# Patient Record
Sex: Male | Born: 1970 | Race: White | Hispanic: No | Marital: Single | Smoking: Former smoker
Health system: Southern US, Community
[De-identification: ages and names within clinical notes are randomized; demographics above are authoritative.]

## PROBLEM LIST (undated history)

## (undated) HISTORY — PX: EYE SURGERY: SHX253

---

## 1998-03-01 ENCOUNTER — Emergency Department (HOSPITAL_COMMUNITY): Admission: EM | Admit: 1998-03-01 | Discharge: 1998-03-02 | Payer: Self-pay | Admitting: Emergency Medicine

## 2010-05-03 ENCOUNTER — Emergency Department (HOSPITAL_COMMUNITY)
Admission: EM | Admit: 2010-05-03 | Discharge: 2010-05-03 | Payer: Self-pay | Source: Home / Self Care | Admitting: Family Medicine

## 2015-01-14 ENCOUNTER — Encounter: Payer: Self-pay | Admitting: Family Medicine

## 2015-01-14 ENCOUNTER — Ambulatory Visit (INDEPENDENT_AMBULATORY_CARE_PROVIDER_SITE_OTHER): Payer: Self-pay | Admitting: Family Medicine

## 2015-01-14 VITALS — BP 104/59 | HR 77 | Temp 98.7°F | Resp 20 | Ht 64.0 in | Wt 134.0 lb

## 2015-01-14 DIAGNOSIS — M25561 Pain in right knee: Secondary | ICD-10-CM

## 2015-01-14 NOTE — Assessment & Plan Note (Signed)
Pain and popping for 6 months, better with brace, nsaids, steroids, normal exam. 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 Ice and elevation in the evenings

## 2015-01-14 NOTE — Progress Notes (Signed)
   Subjective:    Patient ID: Billy Simmons, male    DOB: [birth year], 44 y.o.   MRN: [id number]  HPI Pt presents to establish care. 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 Otherwise, no meds, surgeries, hospitalizations. Family history reviewed and documented in chart.   He has been having knee pain for the past 6 months. It started when he was squatting in a crawl space and twisted his knee. It pops and hurts and becomes very inflamed if he does not wear his brace. As long as he wears the brace it does not bother him. When it got very swollen he went to an urgent care and did a steroid taper which helped, he occasionally uses ibuprofen when it acts up but usually does not need anything. It does not give out or feel unstable. No fevers/chills.   Review of Systems See HPI    Objective:   Physical Exam  Constitutional: He is oriented to person, place, and time. He appears well-developed and well-nourished. No distress.  HENT:  Head: Normocephalic and atraumatic.  Eyes: Conjunctivae are normal.  Cardiovascular: Normal rate, regular rhythm, normal heart sounds and intact distal pulses.   No murmur heard. Pulmonary/Chest: Effort normal and breath sounds normal. No respiratory distress. He has no wheezes.  Abdominal: Soft. Bowel sounds are normal. He exhibits no distension. There is no tenderness.  Musculoskeletal:       Right knee: He exhibits normal range of motion, no swelling, no effusion, no ecchymosis, no deformity, no laceration, no erythema, normal alignment, no LCL laxity, normal patellar mobility, no bony tenderness and no MCL laxity. No tenderness found.  Negative drawer signs  Neurological: He is alert and oriented to person, place, and time.  Skin: Skin is warm and dry. He is not diaphoretic.  Psychiatric: He has a normal mood and affect. His behavior is normal.  Nursing note and vitals reviewed.           Assessment & Plan:

## 2015-01-14 NOTE — Patient Instructions (Addendum)
Meniscus Tear with Phase I Rehab The meniscus is a C-shaped cartilage structure, located in the knee joint between the thigh bone (femur) and the shinbone (tibia). Two menisci are located in each knee joint: the inner and outer meniscus. The meniscus acts as an adapter between the thigh bone and shinbone, allowing them to fit properly together. It also functions as a shock absorber, to reduce the stress placed on the knee joint and to help supply nutrients to the knee joint cartilage. As people age, the meniscus begins to harden and become more vulnerable to injury. Meniscus tears are a common injury, especially in older athletes. Inner meniscus tears are more common than outer meniscus tears.  SYMPTOMS   Pain in the knee, especially with standing or squatting with the affected leg.  Tenderness along the joint line.  Swelling in the knee joint (effusion), usually starting 1 to 2 days after injury.  Locking or catching of the knee joint, causing inability to straighten the knee completely.  Giving way or buckling of the knee. CAUSES  A meniscus tear occurs when a force is placed on the meniscus that is greater than it can handle. Common causes of injury include:  Direct hit (trauma) to the knee.  Twisting, pivoting, or cutting (rapidly changing direction while running), kneeling or squatting.  Without injury, due to aging. RISK INCREASES WITH:  Contact sports (football, rugby).  Sports in which cleats are used with pivoting (soccer, lacrosse) or sports in which good shoe grip and sudden change in direction are required (racquetball, basketball, squash).  Previous knee injury.  Associated knee injury, particularly ligament injuries.  Poor strength and flexibility. PREVENTION  Warm up and stretch properly before activity.  Maintain physical fitness:  Strength, flexibility, and endurance.  Cardiovascular fitness.  Protect the knee with a brace or elastic bandage.  Wear  properly fitted protective equipment (proper cleats for the surface). PROGNOSIS  Sometimes, meniscus tears heal on their own. However, definitive treatment requires surgery, followed by at least 6 weeks of recovery.  RELATED COMPLICATIONS   Recurring symptoms that result in a chronic problem.  Repeated knee injury, especially if sports are resumed too soon after injury or surgery.  Progression of the tear (the tear gets larger), if untreated.  Arthritis of the knee in later years (with or without surgery).  Complications of surgery, including infection, bleeding, injury to nerves (numbness, weakness, paralysis) continued pain, giving way, locking, nonhealing of meniscus (if repaired), need for further surgery, and knee stiffness (loss of motion). TREATMENT  Treatment first involves the use of ice and medicine, to reduce pain and inflammation. You may find using crutches to walk more comfortable. However, it is okay to bear weight on the injured knee, if the pain will allow it. Surgery is often advised as a definitive treatment. Surgery is performed through an incision near the joint (arthroscopically). The torn piece of the meniscus is removed, and if possible the joint cartilage is repaired. After surgery, the joint must be restrained. After restraint, it is important to perform strengthening and stretching exercises to help regain strength and a full range of motion. These exercises may be completed at home or with a therapist.  MEDICATION  If pain medicine is needed, nonsteroidal anti-inflammatory medicines (aspirin and ibuprofen), or other minor pain relievers (acetaminophen), are often advised.  Do not take pain medicine for 7 days before surgery.  Prescription pain relievers may be given, if your caregiver thinks they are needed. Use only as directed and   only as much as you need. HEAT AND COLD  Cold treatment (icing) should be applied for 10 to 15 minutes every 2 to 3 hours for  inflammation and pain, and immediately after activity that aggravates your symptoms. Use ice packs or an ice massage.  Heat treatment may be used before performing stretching and strengthening activities prescribed by your caregiver, physical therapist, or athletic trainer. Use a heat pack or a warm water soak. SEEK MEDICAL CARE IF:   Symptoms get worse or do not improve in 2 weeks, despite treatment.  New, unexplained symptoms develop. (Drugs used in treatment may produce side effects.) EXERCISES RANGE OF MOTION (ROM) AND STRETCHING EXERCISES - Meniscus Tear, Non-operative, Phase I These are some of the initial exercises with which you may start your rehabilitation program, until you see your caregiver again or until your symptoms are resolved. Remember:   These initial exercises are intended to be gentle. They will help you restore motion without increasing any swelling.  Completing these exercises allows less painful movement and prepares you for the more aggressive strengthening exercises in Phase II.  An effective stretch should be held for at least 30 seconds.  A stretch should never be painful. You should only feel a gentle lengthening or release in the stretched tissue. RANGE OF MOTION - Knee Flexion, Active  Lie on your back with both knees straight. (If this causes back discomfort, bend your healthy knee, placing your foot flat on the floor.)  Slowly slide your heel back toward your buttocks until you feel a gentle stretch in the front of your knee or thigh.  Slowly slide your heel back to the starting position.Marland Kitchen  RANGE OF MOTION - Knee Flexion and Extension, Active-Assisted  Sit on the edge of a table or chair with your thighs firmly supported. It may be helpful to place a folded towel under the end of your right / left thigh.  Flexion (bending): Place the ankle of your healthy leg on top of the other ankle. Use your healthy leg to gently bend your right / left knee until you  feel a mild tension across the top of your knee.  Extension (straightening): Switch your ankles so your right / left leg is on top. Use your healthy leg to straighten your right / left knee until you feel a mild tension on the backside of your knee.  STRETCH - Knee Flexion, Supine  Lie on the floor with your right / left heel and foot lightly touching the wall. (Place both feet on the wall if you do not use a door frame.)  Without using any effort, allow gravity to slide your foot down the wall slowly until you feel a gentle stretch in the front of your right / left knee.  Then return the leg to the starting position, using your healthy leg for help, if needed.  STRETCH - Knee Extension Sitting  Sit with your right / left leg/heel propped on another chair, coffee table, or foot stool.  Allow your leg muscles to relax, letting gravity straighten out your knee.*  You should feel a stretch behind your right / left knee.   STRENGTHENING EXERCISES - Meniscus Tear, Non-operative, Phase I These exercises may help you when beginning to rehabilitate your injury. They may resolve your symptoms with or without further involvement from your physician, physical therapist or athletic trainer. While completing these exercises, remember:   Muscles can gain both the endurance and the strength needed for everyday activities through  controlled exercises.  Complete these exercises as instructed by your physician, physical therapist or athletic trainer. Progress the resistance and repetitions only as guided. STRENGTH - Quadriceps, Isometrics  Lie on your back with your right / left leg extended and your opposite knee bent.  Gradually tense the muscles in the front of your right / left thigh. You should see either your knee cap slide up toward your hip or increased dimpling just above the knee. This motion will push the back of the knee down toward the floor, mat, or bed on which you are lying.  Hold the  muscle as tight as you can, without increasing your pain  Relax the muscles slowly and completely between each repetition.  STRENGTH - Quadriceps, Short Arcs   Lie on your back. Place a towel roll under your right / left knee, so that the knee bends slightly.  Raise only your lower leg by tightening the muscles in the front of your thigh. Do not allow your thigh to rise.  STRENGTH - Quadriceps, Straight Leg Raises  Quality counts! Watch for signs that the quadriceps muscle is working, to be sure you are strengthening the correct muscles and not "cheating" by substituting with healthier muscles.  Lay on your back with your right / left leg extended and your opposite knee bent.  Tense the muscles in the front of your right / left thigh. You should see either your knee cap slide up or increased dimpling just above the knee. Your thigh may even shake a bit.  Tighten these muscles even more and raise your leg 4 to 6 inches off the floor.  Keeping these muscles tense, lower your leg.  Relax the muscles slowly and completely in between each repetition.  STRENGTH - Hamstring, Curls   Lay on your stomach with your legs extended. (If you lay on a bed, your feet may hang over the edge.)  Tighten the muscles in the back of your thigh to bend your right / left knee up to 90 degrees. Keep your hips flat on the bed.  Hold this position  Slowly lower your leg back to the starting position.  STRENGTH - Quadriceps, Squats  Stand in a door frame so that your feet and knees are in line with the frame.  Use your hands for balance, not support, on the frame.  Slowly lower your weight, bending at the hips and knees. Keep your lower legs upright so that they are parallel with the door frame. Squat only within the range that does not increase your knee pain. Never let your hips drop below your knees.  Slowly return upright, pushing with your legs, not pulling with your hands.  STRENGTH - Quad/VMO,  Isometric   Sit in a chair with your right / left knee slightly bent. With your fingertips, feel the VMO muscle just above the inside of your knee. The VMO is important in controlling the position of your kneecap.  Keeping your fingertips on this muscle. Without actually moving your leg, attempt to drive your knee down as if straightening your leg. You should feel your VMO tense. If you have a difficult time, you may wish to try the same exercise on your healthy knee first.  Tense this muscle as hard as you can without increasing any knee pain.  Relax the muscles slowly and completely in between each repetition. Document Released: 07/30/1998 Document Revised: 10/08/2011 Document Reviewed: 10/28/2008 [facility] Patient Information 2015 [place], [place]. This information is not intended to  replace advice given to you by your health care provider. Make sure you discuss any questions you have with your health care provider.

## 2015-11-03 ENCOUNTER — Ambulatory Visit (INDEPENDENT_AMBULATORY_CARE_PROVIDER_SITE_OTHER): Payer: Self-pay | Admitting: Family Medicine

## 2015-11-03 VITALS — BP 128/84 | HR 81 | Temp 98.0°F | Resp 18 | Ht 64.0 in | Wt 130.0 lb

## 2015-11-03 DIAGNOSIS — M25442 Effusion, left hand: Secondary | ICD-10-CM

## 2015-11-03 NOTE — Patient Instructions (Addendum)
     IF you received an x-ray today, you will receive an invoice from [facility]. Please contact [facility] at [phone] with questions or concerns regarding your invoice.   IF you received labwork today, you will receive an invoice from [facility]. Please contact [facility] at [phone] with questions or concerns regarding your invoice.   Our billing staff will not be able to assist you with questions regarding bills from these companies.  You will be contacted with the lab results as soon as they are available. The fastest way to get your results is to activate your My Chart account. Instructions are located on the last page of this paperwork. If you have not heard from us regarding the results in 2 weeks, please contact this office.     You likely did have a jammed finger or possible fracture in that knuckle, but with the good range of motion and function you have today, I do not anticipate any specific treatment by hand surgeon at this time. If you would like to have an x-ray - return to your convenience.  If pain/difficulty with use of the hand, would recommend physical therapy with hand therapist.  You also have the option of seeing a hand surgeon, Let me know, and I can refer you without an office visit.    Return to the clinic or go to the nearest emergency room if any of your symptoms worsen or new symptoms occur.

## 2015-11-03 NOTE — Progress Notes (Signed)
Subjective:  By signing my name below, I, Stann Oresung-Kai Tsai, attest that this documentation has been prepared under the direction and in the presence of Meredith StaggersJeffrey Brecklynn Jian, MD. Electronically Signed: Stann Oresung-Kai Tsai, Scribe. 11/03/2015 , 8:53 AM .  Patient was seen in Room 2 .   Patient ID: Billy Simmons, male    DOB: 19-Dec-1970, 45 y.o.   MRN: [id number] Chief Complaint  Patient presents with  . Finger Injury    left middle finger. Injury happened late last year.    HPI Billy Simmons is a 45 y.o. male  Patient is here with left middle finger pain from an injury that occurred about 5 months ago (Oct - Nov 2016). He reports being on a ladder fixing his gutter, and the ladder was about to fall. His left middle finger was caught in the rung of the ladder and the finger was pulled towards the thumb. Initially, it swelled up in the middle of the knuckle and bruising on the side of the finger. He applied some ice and the swelling did improve. He is able to function and work with it; although, he occasionally has a pop and soreness in the area when he does specific activity while at work. He denies xray or treatment.   He works in Estate agentpainting and carpentry.  He's ambidextrous, but primarily right hand dominant.   Patient Active Problem List   Diagnosis Date Noted  . Right knee pain 01/14/2015   History reviewed. No pertinent past medical history. Past Surgical History  Procedure Laterality Date  . Eye surgery  as a child   No Known Allergies Prior to Admission medications   Not on File   Social History   Social History  . Marital Status: Single    Spouse Name: N/A  . Number of Children: N/A  . Years of Education: N/A   Occupational History  . Not on file.   Social History Main Topics  . Smoking status: Former Smoker    Types: Cigarettes    Quit date: 07/31/2011  . Smokeless tobacco: Never Used  . Alcohol Use: 1.2 oz/week    2 Cans of beer per week  . Drug Use: Yes    Special:  Marijuana  . Sexual Activity: Yes   Other Topics Concern  . Not on file   Social History Narrative   Review of Systems  Constitutional: Negative for fever, chills and fatigue.  Gastrointestinal: Negative for nausea, vomiting and diarrhea.  Musculoskeletal: Positive for arthralgias. Negative for myalgias and joint swelling.  Skin: Negative for rash and wound.  Neurological: Negative for weakness and numbness.      Objective:   Physical Exam  Constitutional: He is oriented to person, place, and time. He appears well-developed and well-nourished. No distress.  HENT:  Head: Normocephalic and atraumatic.  Eyes: EOM are normal. Pupils are equal, round, and reactive to light.  Neck: Neck supple.  Cardiovascular: Normal rate.   Pulmonary/Chest: Effort normal. No respiratory distress.  Musculoskeletal: Normal range of motion.  Prominence left 3rd PIP dorsally, some bony prominence dorsal and medial lateral, full extension, full extension strength, full flexion at the PIP, full flexion strength  Neurological: He is alert and oriented to person, place, and time.  Skin: Skin is warm and dry.  Psychiatric: He has a normal mood and affect. His behavior is normal.  Nursing note and vitals reviewed.   Filed Vitals:   11/03/15 0826  BP: 128/84  Pulse: 81  Temp: 98 F (  36.7 C)  TempSrc: Oral  Resp: 18  Height:  (1.626 m)  Weight: 130 lb (58.968 kg)  SpO2: 98%      Assessment & Plan:   Billy Simmons is a 45 y.o. male Swelling of joint of hand, left  Suspected jammed PIP of third finger versus fracture, but now 5 months out from injury. He actually has good range of motion and strength at that joint, minimal discomfort, and slight swelling is likely callus/bony healing. Offered physical therapy or hand surgeon evaluation, but do not anticipate any specific treatment at this time because of his good range of motion and function. Also discussed x-ray, but this was declined at  present RTC precautions discussed, but if he would like to see hand surgeon or physical therapy can call and I can refer him there.  No orders of the defined types were placed in this encounter.      Patient Instructions       IF you received an x-ray today, you will receive an invoice from [facility]. Please contact [facility] at [phone] with questions or concerns regarding your invoice.   IF you received labwork today, you will receive an invoice from [facility]. Please contact [facility] at [phone] with questions or concerns regarding your invoice.   Our billing staff will not be able to assist you with questions regarding bills from these companies.  You will be contacted with the lab results as soon as they are available. The fastest way to get your results is to activate your My Chart account. Instructions are located on the last page of this paperwork. If you have not heard from [place] regarding the results in 2 weeks, please contact this office.     You likely did have a jammed finger or possible fracture in that knuckle, but with the good range of motion and function you have today, I do not anticipate any specific treatment by hand surgeon at this time. If you would like to have an x-ray - return to your convenience.  If pain/difficulty with use of the hand, would recommend physical therapy with hand therapist.  You also have the option of seeing a hand surgeon, Let me know, and I can refer you without an office visit.    Return to the clinic or go to the nearest emergency room if any of your symptoms worsen or new symptoms occur.

## 2015-12-22 ENCOUNTER — Ambulatory Visit (INDEPENDENT_AMBULATORY_CARE_PROVIDER_SITE_OTHER): Payer: Self-pay | Admitting: Family Medicine

## 2015-12-22 VITALS — BP 112/70 | HR 89 | Temp 98.3°F | Resp 18 | Ht 64.0 in | Wt 133.2 lb

## 2015-12-22 DIAGNOSIS — J069 Acute upper respiratory infection, unspecified: Secondary | ICD-10-CM

## 2015-12-22 MED ORDER — AMOXICILLIN 875 MG PO TABS: 875.0000 mg | ORAL_TABLET | Freq: Two times a day (BID) | ORAL | Status: AC

## 2015-12-22 NOTE — Patient Instructions (Addendum)
You likely have a viral illness, please try to treat with over the counter medications for cough (sample Mucinex DM) and pain/fever (sample Aleve). If you are not better in 3-5 days, can start antibiotic.  Drink enough liquids until your urine is light yellow.  Please come back in or go to the emergency room if you develop a persistent fever over 101, shortness of breath/wheeze or persistent vomiting    IF you received an x-ray today, you will receive an invoice from [facility]. Please contact [facility] at [phone] with questions or concerns regarding your invoice.   IF you received labwork today, you will receive an invoice from [facility]. Please contact [facility] at [phone] with questions or concerns regarding your invoice.   Our billing staff will not be able to assist you with questions regarding bills from these companies.  You will be contacted with the lab results as soon as they are available. The fastest way to get your results is to activate your My Chart account. Instructions are located on the last page of this paperwork. If you have not heard from us regarding the results in 2 weeks, please contact this office.     Upper Respiratory Infection, Adult Most upper respiratory infections (URIs) are a viral infection of the air passages leading to the lungs. A URI affects the nose, throat, and upper air passages. The most common type of URI is nasopharyngitis and is typically referred to as "the common cold." URIs run their course and usually go away on their own. Most of the time, a URI does not require medical attention, but sometimes a bacterial infection in the upper airways can follow a viral infection. This is called a secondary infection. Sinus and middle ear infections are common types of secondary upper respiratory infections. Bacterial pneumonia can also complicate a URI. A URI can worsen asthma and chronic obstructive  pulmonary disease (COPD). Sometimes, these complications can require emergency medical care and may be life threatening.  CAUSES Almost all URIs are caused by viruses. A virus is a type of germ and can spread from one person to another.  RISKS FACTORS You may be at risk for a URI if:   You smoke.   You have chronic heart or lung disease.  You have a weakened defense (immune) system.   You are very young or very old.   You have nasal allergies or asthma.  You work in crowded or poorly ventilated areas.  You work in health care facilities or schools. SIGNS AND SYMPTOMS  Symptoms typically develop 2-3 days after you come in contact with a cold virus. Most viral URIs last 7-10 days. However, viral URIs from the influenza virus (flu virus) can last 14-18 days and are typically more severe. Symptoms may include:   Runny or stuffy (congested) nose.   Sneezing.   Cough.   Sore throat.   Headache.   Fatigue.   Fever.   Loss of appetite.   Pain in your forehead, behind your eyes, and over your cheekbones (sinus pain).  Muscle aches.  DIAGNOSIS  Your health care provider may diagnose a URI by:  Physical exam.  Tests to check that your symptoms are not due to another condition such as:  Strep throat.  Sinusitis.  Pneumonia.  Asthma. TREATMENT  A URI goes away on its own with time. It cannot be cured with medicines, but medicines may be prescribed or recommended to relieve symptoms. Medicines may help:  Reduce  your fever.  Reduce your cough.  Relieve nasal congestion. HOME CARE INSTRUCTIONS   Take medicines only as directed by your health care provider.   Gargle warm saltwater or take cough drops to comfort your throat as directed by your health care provider.  Use a warm mist humidifier or inhale steam from a shower to increase air moisture. This may make it easier to breathe.  Drink enough fluid to keep your urine clear or pale yellow.   Eat  soups and other clear broths and maintain good nutrition.   Rest as needed.   Return to work when your temperature has returned to normal or as your health care provider advises. You may need to stay home longer to avoid infecting others. You can also use a face mask and careful hand washing to prevent spread of the virus.  Increase the usage of your inhaler if you have asthma.   Do not use any tobacco products, including cigarettes, chewing tobacco, or electronic cigarettes. If you need help quitting, ask your health care provider. PREVENTION  The best way to protect yourself from getting a cold is to practice good hygiene.   Avoid oral or hand contact with people with cold symptoms.   Wash your hands often if contact occurs.  There is no clear evidence that vitamin C, vitamin E, echinacea, or exercise reduces the chance of developing a cold. However, it is always recommended to get plenty of rest, exercise, and practice good nutrition.  SEEK MEDICAL CARE IF:   You are getting worse rather than better.   Your symptoms are not controlled by medicine.   You have chills.  You have worsening shortness of breath.  You have brown or red mucus.  You have yellow or brown nasal discharge.  You have pain in your face, especially when you bend forward.  You have a fever.  You have swollen neck glands.  You have pain while swallowing.  You have white areas in the back of your throat. SEEK IMMEDIATE MEDICAL CARE IF:   You have severe or persistent:  Headache.  Ear pain.  Sinus pain.  Chest pain.  You have chronic lung disease and any of the following:  Wheezing.  Prolonged cough.  Coughing up blood.  A change in your usual mucus.  You have a stiff neck.  You have changes in your:  Vision.  Hearing.  Thinking.  Mood. MAKE SURE YOU:   Understand these instructions.  Will watch your condition.  Will get help right away if you are not doing well or  get worse.   This information is not intended to replace advice given to you by your health care provider. Make sure you discuss any questions you have with your health care provider.   Document Released: 01/09/2001 Document Revised: 11/30/2014 Document Reviewed: 10/21/2013 Elsevier Interactive Patient Education [employer]

## 2015-12-22 NOTE — Progress Notes (Signed)
   Subjective:    Patient ID: Billy Simmons, male    DOB: Jul 03, 1971, 45 y.o.   MRN: [id number]  HPI This is a pleasant male who presents today with 2 days of cough, chest congestion, chest pain with cough, green sputum. Smoked cigarettes until 4 years ago. Smokes occasional marijuana. Took tylenol yesterday, slept ok. Feels fatigued. Sinus pressure. Itchy throat, no ear pain.   History reviewed. No pertinent past medical history. Past Surgical History  Procedure Laterality Date  . Eye surgery  as a child   Family History  Problem Relation Age of Onset  . Depression Mother   . Mental illness Mother   . Cancer Father    Social History  Substance Use Topics  . Smoking status: Former Smoker    Types: Cigarettes    Quit date: 07/31/2011  . Smokeless tobacco: Never Used  . Alcohol Use: 1.2 oz/week    2 Cans of beer per week      Review of Systems Per HPI     Objective:   Physical Exam  Constitutional: He is oriented to person, place, and time. He appears well-developed and well-nourished.  HENT:  Head: Normocephalic and atraumatic.  Right Ear: Tympanic membrane, external ear and ear canal normal.  Left Ear: Tympanic membrane, external ear and ear canal normal.  Nose: Mucosal edema and rhinorrhea present. Right sinus exhibits no maxillary sinus tenderness and no frontal sinus tenderness. Left sinus exhibits no maxillary sinus tenderness and no frontal sinus tenderness.  Mouth/Throat: Uvula is midline and mucous membranes are normal. Posterior oropharyngeal erythema present. No oropharyngeal exudate or posterior oropharyngeal edema.  Cardiovascular: Normal rate, regular rhythm and normal heart sounds.   Pulmonary/Chest: Effort normal and breath sounds normal.  Neurological: He is alert and oriented to person, place, and time.  Skin: Skin is warm and dry.  Psychiatric: He has a normal mood and affect. His behavior is normal. Judgment and thought content normal.  Vitals  reviewed.     BP 112/70 mmHg  Pulse 89  Temp(Src) 98.3 F (36.8 C) (Oral)  Resp 18  Ht 5\' 4"  (1.626 m)  Wt 133 lb 3.2 oz (60.419 kg)  BMI 22.85 kg/m2  SpO2 97%     Assessment & Plan:  1. Upper respiratory infection with cough and congestion - likely viral, discussed with patient and provided instructions for symptomatic relief  - Provided wait and see antibiotic prescription that he can take if not better in 3-5 days - amoxicillin (AMOXIL) 875 MG tablet; Take 1 tablet (875 mg total) by mouth 2 (two) times daily.  Dispense: 14 tablet; Refill: 0   Olean Reeeborah Kleo Dungee, FNP-BC  [facility]  12/24/2015 5:07 PM

## 2017-07-02 ENCOUNTER — Emergency Department (HOSPITAL_COMMUNITY): Payer: Self-pay

## 2017-07-02 ENCOUNTER — Encounter (HOSPITAL_COMMUNITY): Payer: Self-pay

## 2017-07-02 ENCOUNTER — Other Ambulatory Visit: Payer: Self-pay

## 2017-07-02 ENCOUNTER — Inpatient Hospital Stay (HOSPITAL_COMMUNITY): Payer: Self-pay

## 2017-07-02 ENCOUNTER — Inpatient Hospital Stay (HOSPITAL_COMMUNITY)
Admission: EM | Admit: 2017-07-02 | Discharge: 2017-07-09 | DRG: 511 | Disposition: A | Discharge status: Home / Self Care | Payer: Self-pay | Attending: General Surgery | Admitting: General Surgery

## 2017-07-02 DIAGNOSIS — S0012XA Contusion of left eyelid and periocular area, initial encounter: Secondary | ICD-10-CM | POA: Diagnosis present

## 2017-07-02 DIAGNOSIS — W19XXXA Unspecified fall, initial encounter: Secondary | ICD-10-CM

## 2017-07-02 DIAGNOSIS — W1789XA Other fall from one level to another, initial encounter: Secondary | ICD-10-CM

## 2017-07-02 DIAGNOSIS — S2242XA Multiple fractures of ribs, left side, initial encounter for closed fracture: Secondary | ICD-10-CM

## 2017-07-02 DIAGNOSIS — Z23 Encounter for immunization: Secondary | ICD-10-CM

## 2017-07-02 DIAGNOSIS — S2249XA Multiple fractures of ribs, unspecified side, initial encounter for closed fracture: Secondary | ICD-10-CM

## 2017-07-02 DIAGNOSIS — S52572A Other intraarticular fracture of lower end of left radius, initial encounter for closed fracture: Principal | ICD-10-CM | POA: Diagnosis present

## 2017-07-02 DIAGNOSIS — E876 Hypokalemia: Secondary | ICD-10-CM | POA: Diagnosis present

## 2017-07-02 DIAGNOSIS — R52 Pain, unspecified: Secondary | ICD-10-CM

## 2017-07-02 DIAGNOSIS — J939 Pneumothorax, unspecified: Secondary | ICD-10-CM

## 2017-07-02 DIAGNOSIS — Z9889 Other specified postprocedural states: Secondary | ICD-10-CM

## 2017-07-02 DIAGNOSIS — S52502A Unspecified fracture of the lower end of left radius, initial encounter for closed fracture: Secondary | ICD-10-CM

## 2017-07-02 DIAGNOSIS — S299XXA Unspecified injury of thorax, initial encounter: Secondary | ICD-10-CM

## 2017-07-02 DIAGNOSIS — Z9689 Presence of other specified functional implants: Secondary | ICD-10-CM

## 2017-07-02 DIAGNOSIS — T148XXA Other injury of unspecified body region, initial encounter: Secondary | ICD-10-CM

## 2017-07-02 DIAGNOSIS — W11XXXA Fall on and from ladder, initial encounter: Secondary | ICD-10-CM | POA: Diagnosis present

## 2017-07-02 DIAGNOSIS — J9382 Other air leak: Secondary | ICD-10-CM

## 2017-07-02 DIAGNOSIS — F1290 Cannabis use, unspecified, uncomplicated: Secondary | ICD-10-CM | POA: Diagnosis present

## 2017-07-02 DIAGNOSIS — Z87891 Personal history of nicotine dependence: Secondary | ICD-10-CM

## 2017-07-02 DIAGNOSIS — S060X9A Concussion with loss of consciousness of unspecified duration, initial encounter: Secondary | ICD-10-CM | POA: Diagnosis present

## 2017-07-02 DIAGNOSIS — S270XXA Traumatic pneumothorax, initial encounter: Secondary | ICD-10-CM | POA: Diagnosis present

## 2017-07-02 DIAGNOSIS — R0902 Hypoxemia: Secondary | ICD-10-CM | POA: Diagnosis present

## 2017-07-02 DIAGNOSIS — Z419 Encounter for procedure for purposes other than remedying health state, unspecified: Secondary | ICD-10-CM

## 2017-07-02 LAB — CBC
HEMATOCRIT: 46.7 % (ref 39.0–52.0)
HEMATOCRIT: 44.9 % (ref 39.0–52.0)
HEMOGLOBIN: 16 g/dL (ref 13.0–17.0)
HEMOGLOBIN: 15.6 g/dL (ref 13.0–17.0)
MCH: 33.1 pg (ref 26.0–34.0)
MCH: 33.3 pg (ref 26.0–34.0)
MCHC: 34.3 g/dL (ref 30.0–36.0)
MCHC: 34.7 g/dL (ref 30.0–36.0)
MCV: 96.7 fL (ref 78.0–100.0)
MCV: 95.9 fL (ref 78.0–100.0)
Platelets: 275 10*3/uL (ref 150–400)
Platelets: 226 10*3/uL (ref 150–400)
RBC: 4.83 MIL/uL (ref 4.22–5.81)
RBC: 4.68 MIL/uL (ref 4.22–5.81)
RDW: 12.8 % (ref 11.5–15.5)
RDW: 12.7 % (ref 11.5–15.5)
WBC: 10.6 10*3/uL — ABNORMAL HIGH (ref 4.0–10.5)
WBC: 13.5 10*3/uL — ABNORMAL HIGH (ref 4.0–10.5)

## 2017-07-02 LAB — BASIC METABOLIC PANEL
Anion gap: 9 (ref 5–15)
BUN: 5 mg/dL — AB (ref 6–20)
CHLORIDE: 102 mmol/L (ref 101–111)
CO2: 28 mmol/L (ref 22–32)
Calcium: 9.1 mg/dL (ref 8.9–10.3)
Creatinine, Ser: 1.02 mg/dL (ref 0.61–1.24)
GFR calc Af Amer: >60 mL/min (ref >60)
GFR calc non Af Amer: >60 mL/min (ref >60)
GLUCOSE: 131 mg/dL — AB (ref 65–99)
POTASSIUM: 3.2 mmol/L — AB (ref 3.5–5.1)
Sodium: 139 mmol/L (ref 135–145)

## 2017-07-02 LAB — CREATININE, SERUM
CREATININE: 0.86 mg/dL (ref 0.61–1.24)
GFR calc non Af Amer: >60 mL/min (ref >60)

## 2017-07-02 MED ORDER — POVIDONE-IODINE 10 % EX SWAB
2.0000 "application " | Freq: Once | CUTANEOUS | Status: AC
  Administered 2017-07-03: 2 via TOPICAL

## 2017-07-02 MED ORDER — CEFAZOLIN SODIUM-DEXTROSE 2-4 GM/100ML-% IV SOLN
2.0000 g | INTRAVENOUS | Status: AC
  Administered 2017-07-03: 2 g via INTRAVENOUS
  Filled 2017-07-02: qty 100

## 2017-07-02 MED ORDER — OXYCODONE HCL 5 MG PO TABS
5.0000 mg | ORAL_TABLET | ORAL | Status: DC | PRN
  Administered 2017-07-03 – 2017-07-05 (×11): 10 mg via ORAL
  Administered 2017-07-06: 5 mg via ORAL
  Administered 2017-07-06 – 2017-07-07 (×3): 10 mg via ORAL
  Filled 2017-07-02 (×7): qty 2
  Filled 2017-07-02: qty 1
  Filled 2017-07-02 (×7): qty 2

## 2017-07-02 MED ORDER — SODIUM CHLORIDE 0.9 % IV SOLN
INTRAVENOUS | Status: DC
  Administered 2017-07-02 – 2017-07-04 (×4): via INTRAVENOUS

## 2017-07-02 MED ORDER — PANTOPRAZOLE SODIUM 40 MG PO TBEC
40.0000 mg | DELAYED_RELEASE_TABLET | Freq: Every day | ORAL | Status: DC
  Administered 2017-07-04 – 2017-07-09 (×5): 40 mg via ORAL
  Filled 2017-07-02 (×5): qty 1

## 2017-07-02 MED ORDER — ENOXAPARIN SODIUM 40 MG/0.4ML ~~LOC~~ SOLN
40.0000 mg | SUBCUTANEOUS | Status: DC
  Administered 2017-07-02: 40 mg via SUBCUTANEOUS
  Filled 2017-07-02: qty 0.4

## 2017-07-02 MED ORDER — HYDRALAZINE HCL 20 MG/ML IJ SOLN: 10.0000 mg | INTRAMUSCULAR | Status: DC | PRN

## 2017-07-02 MED ORDER — HYDROMORPHONE HCL 1 MG/ML IJ SOLN
1.0000 mg | INTRAMUSCULAR | Status: DC | PRN
  Administered 2017-07-02 (×2): 1 mg via INTRAVENOUS
  Filled 2017-07-02 (×2): qty 1

## 2017-07-02 MED ORDER — IPRATROPIUM-ALBUTEROL 0.5-2.5 (3) MG/3ML IN SOLN
3.0000 mL | Freq: Four times a day (QID) | RESPIRATORY_TRACT | Status: DC
  Administered 2017-07-02 (×2): 3 mL via RESPIRATORY_TRACT
  Filled 2017-07-02 (×2): qty 3

## 2017-07-02 MED ORDER — ONDANSETRON HCL 4 MG/2ML IJ SOLN: 4.0000 mg | Freq: Four times a day (QID) | INTRAMUSCULAR | Status: DC | PRN

## 2017-07-02 MED ORDER — PANTOPRAZOLE SODIUM 40 MG IV SOLR
40.0000 mg | Freq: Every day | INTRAVENOUS | Status: DC
Start: 2017-07-02 — End: 2017-07-05
  Administered 2017-07-02 – 2017-07-03 (×2): 40 mg via INTRAVENOUS
  Filled 2017-07-02 (×2): qty 40

## 2017-07-02 MED ORDER — INFLUENZA VAC SPLIT QUAD 0.5 ML IM SUSY
0.5000 mL | PREFILLED_SYRINGE | INTRAMUSCULAR | Status: AC
Start: 2017-07-03 — End: 2017-07-09
  Administered 2017-07-09: 0.5 mL via INTRAMUSCULAR
  Filled 2017-07-02: qty 0.5

## 2017-07-02 MED ORDER — HYDROMORPHONE HCL 1 MG/ML IJ SOLN
1.0000 mg | Freq: Once | INTRAMUSCULAR | Status: AC
Start: 2017-07-02 — End: 2017-07-02
  Administered 2017-07-02: 1 mg via INTRAVENOUS

## 2017-07-02 MED ORDER — IPRATROPIUM-ALBUTEROL 0.5-2.5 (3) MG/3ML IN SOLN
3.0000 mL | Freq: Three times a day (TID) | RESPIRATORY_TRACT | Status: DC
  Filled 2017-07-02: qty 3

## 2017-07-02 MED ORDER — CHLORHEXIDINE GLUCONATE 4 % EX LIQD
60.0000 mL | Freq: Once | CUTANEOUS | Status: AC
  Administered 2017-07-03: 4 via TOPICAL
  Filled 2017-07-02: qty 15

## 2017-07-02 MED ORDER — MIDAZOLAM HCL 2 MG/2ML IJ SOLN
2.0000 mg | Freq: Once | INTRAMUSCULAR | Status: AC
  Administered 2017-07-02: 2 mg via INTRAVENOUS
  Filled 2017-07-02: qty 2

## 2017-07-02 MED ORDER — ONDANSETRON 4 MG PO TBDP: 4.0000 mg | ORAL_TABLET | Freq: Four times a day (QID) | ORAL | Status: DC | PRN

## 2017-07-02 MED ORDER — IOPAMIDOL (ISOVUE-300) INJECTION 61%
INTRAVENOUS | Status: AC
  Administered 2017-07-02: 100 mL
  Filled 2017-07-02: qty 100

## 2017-07-02 MED ORDER — HYDROMORPHONE HCL 1 MG/ML IJ SOLN
1.0000 mg | Freq: Once | INTRAMUSCULAR | Status: AC
Start: 2017-07-02 — End: 2017-07-02
  Administered 2017-07-02: 1 mg via INTRAVENOUS
  Filled 2017-07-02: qty 1

## 2017-07-02 MED ORDER — HYDROMORPHONE HCL 1 MG/ML IJ SOLN
INTRAMUSCULAR | Status: AC
  Filled 2017-07-02: qty 1

## 2017-07-02 NOTE — ED Provider Notes (Signed)
[facility] EMERGENCY DEPARTMENT Provider Note   CSN: [id number] Arrival date & time: 07/02/17  1206     History   Chief Complaint Chief Complaint  Patient presents with  . Fall    HPI Billy Simmons is a 46 y.o. male.  HPI Patient is a 46 year old male presents the emergency department trauma code after falling from approximately 15-18 feet off of a ladder.  He presents with obvious deformity and pain around his left wrist as well as complaints of severe pain in his left lateral chest.  Noted to be hypoxic to 87% on arrival.  Complains of back pain as well.  Denies significant neck pain.  Denies weakness of his arms or legs.  No paresthesias of his upper or lower extremities.  Denies pain in his hips.  Reports mild left-sided abdominal pain.  No use of anticoagulants.  Small hematoma of the left periorbital region.  Positive loss of consciousness.  Pain is severe in severity.   History reviewed. No pertinent past medical history.  There are no active problems to display for this patient.   Past Surgical History:  Procedure Laterality Date  . EYE SURGERY         Home Medications    Prior to Admission medications   Not on File    Family History No family history on file.  Social History Social History   Tobacco Use  . Smoking status: Former Games developer  . Smokeless tobacco: Never Used  Substance Use Topics  . Alcohol use: Yes    Alcohol/week: 7.2 oz    Types: 12 Cans of beer per week  . Drug use: Yes    Types: Marijuana     Allergies   Patient has no known allergies.   Review of Systems Review of Systems  All other systems reviewed and are negative.    Physical Exam Updated Vital Signs BP 120/74   Pulse 99   Temp 98.3 F (36.8 C) (Temporal)   Resp (!) 25   Ht 5\' 4"  (1.626 m)   Wt 61.2 kg (135 lb)   SpO2 94%   BMI 23.17 kg/m   Physical Exam  Constitutional: He is oriented to person, place, and time. He appears  well-developed and well-nourished.  HENT:  Head: Normocephalic.  Small left periorbital hematoma without Laceration  Eyes: EOM are normal.  Neck: Neck supple.  Cervical and paracervical tenderness without cervical step-off.  Immobilized in cervical collar.  Cardiovascular: Normal rate and regular rhythm.  Pulmonary/Chest: Effort normal. He exhibits tenderness.  Abdominal: He exhibits no distension.  Mild left-sided abdominal tenderness.  Musculoskeletal: Normal range of motion.  Full range of motion bilateral hips, knees, ankles.  Full range of motion bilateral shoulders, elbows, right wrist.  Limited range of motion of left wrist secondary to pain with obvious deformity.  Closed injury.  Normal left radial pulse.  Neurological: He is alert and oriented to person, place, and time.  Skin: Skin is warm.  Psychiatric: He has a normal mood and affect.  Nursing note and vitals reviewed.    ED Treatments / Results  Labs (all labs ordered are listed, but only abnormal results are displayed) Labs Reviewed  CBC - Abnormal; Notable for the following components:      Result Value   WBC 10.6 (*)    All other components within normal limits  BASIC METABOLIC PANEL - Abnormal; Notable for the following components:   Potassium 3.2 (*)    Glucose, Bld  131 (*)    BUN 5 (*)    All other components within normal limits    EKG  EKG Interpretation None       Radiology Dg Wrist Complete Left  Result Date: 07/02/2017 CLINICAL DATA:  19 or male with left wrist pain after fall from roof. EXAM: LEFT WRIST - COMPLETE 3+ VIEW COMPARISON:  None. FINDINGS: There is an acute, comminuted, closed and intra-articular fracture involving the distal left radius extending into the radiocarpal and distal radioulnar joints. There is dorsal angulation of the distal fracture fragments with dorsal displacement of the carpal rows. Approximately 2 mm of displacement is noted between the fracture fragments that  involve the articulating surface of the radius as seen on the lateral view. A fracture of the base of the ulnar styloid is noted. No carpal bone fracture is visualized. IMPRESSION: 1. Acute, dorsally angulated and displaced distal radius fracture extending into the radiocarpal and distal radioulnar joints. 2. Nondisplaced ulnar styloid fracture. 3. Intact carpal bones. Electronically Signed   By: Tollie Eth M.D.   On: 07/02/2017 12:40   Ct Head Wo Contrast  Result Date: 07/02/2017 CLINICAL DATA:  Fall from roof EXAM: CT HEAD WITHOUT CONTRAST CT CERVICAL SPINE WITHOUT CONTRAST TECHNIQUE: Multidetector CT imaging of the head and cervical spine was performed following the standard protocol without intravenous contrast. Multiplanar CT image reconstructions of the cervical spine were also generated. COMPARISON:  None. FINDINGS: CT HEAD FINDINGS Brain: No evidence of acute infarction, hemorrhage, hydrocephalus, extra-axial collection or mass lesion/mass effect. Vascular: Negative Skull: Negative Sinuses/Orbits: Mild mucosal edema maxillary sinuses bilaterally. Normal orbit. Other: None CT CERVICAL SPINE FINDINGS Alignment: Normal Skull base and vertebrae: Negative for cervical spine fracture Soft tissues and spinal canal: Negative Disc levels: Mild disc degeneration. Small central disc protrusion C5-6 and C6-7 Upper chest: Mildly displaced fracture left first rib. Mild soft tissue contusion above the left lung apex likely due to mild bleeding. Left pneumothorax. Apical blebs bilaterally. Other: None IMPRESSION: 1. Negative CT head 2. Negative for cervical spine fracture 3. Fracture left first rib with surrounding contusion. Left pneumothorax. Electronically Signed   By: Marlan Palau M.D.   On: 07/02/2017 14:48   Ct Chest W Contrast  Result Date: 07/02/2017 CLINICAL DATA:  Fall from roof.  Trauma EXAM: CT CHEST, ABDOMEN, AND PELVIS WITH CONTRAST TECHNIQUE: Multidetector CT imaging of the chest, abdomen and  pelvis was performed following the standard protocol during bolus administration of intravenous contrast. CONTRAST:  ISOVUE-300 IOPAMIDOL (ISOVUE-300) INJECTION 61% COMPARISON:  None. FINDINGS: CT CHEST FINDINGS Cardiovascular: Heart size within normal limits. Thoracic aorta normal. Pulmonary artery is normal. Mediastinum/Nodes: Negative for pneumomediastinum or hemorrhage. No mass. Lungs/Pleura: Large left pneumothorax with collapse of most of the left lower lobe. Upper lobe emphysema bilaterally. Apical blebs bilaterally. Mild right lower lobe atelectasis. Negative for pleural effusion Musculoskeletal: Multiple left rib fractures. Left first rib fracture. Nondisplaced ribs left 3 through 7 ribs posteriorly and laterally. No displaced rib fractures. No thoracic vertebral fracture. CT ABDOMEN PELVIS FINDINGS Hepatobiliary: Normal liver and gallbladder. Pancreas: Negative Spleen: Negative Adrenals/Urinary Tract: Negative Stomach/Bowel: No bowel obstruction or edema. Vascular/Lymphatic: Mild atherosclerotic disease in the aorta. Reproductive: Prostate and seminal vesicle enlargement bilaterally. Other: No free-fluid Musculoskeletal: Negative for lumbar or pelvic fracture. IMPRESSION: Multiple nondisplaced left rib fractures including the left first rib. Large left pneumothorax. Bibasilar atelectasis left greater than right. Apical emphysema and blebs bilaterally. No acute injury in the abdomen. Electronically Signed   By: Leonette Most  Chestine Sporelark M.D.   On: 07/02/2017 14:55   Ct Cervical Spine Wo Contrast  Result Date: 07/02/2017 CLINICAL DATA:  Fall from roof EXAM: CT HEAD WITHOUT CONTRAST CT CERVICAL SPINE WITHOUT CONTRAST TECHNIQUE: Multidetector CT imaging of the head and cervical spine was performed following the standard protocol without intravenous contrast. Multiplanar CT image reconstructions of the cervical spine were also generated. COMPARISON:  None. FINDINGS: CT HEAD FINDINGS Brain: No evidence of acute  infarction, hemorrhage, hydrocephalus, extra-axial collection or mass lesion/mass effect. Vascular: Negative Skull: Negative Sinuses/Orbits: Mild mucosal edema maxillary sinuses bilaterally. Normal orbit. Other: None CT CERVICAL SPINE FINDINGS Alignment: Normal Skull base and vertebrae: Negative for cervical spine fracture Soft tissues and spinal canal: Negative Disc levels: Mild disc degeneration. Small central disc protrusion C5-6 and C6-7 Upper chest: Mildly displaced fracture left first rib. Mild soft tissue contusion above the left lung apex likely due to mild bleeding. Left pneumothorax. Apical blebs bilaterally. Other: None IMPRESSION: 1. Negative CT head 2. Negative for cervical spine fracture 3. Fracture left first rib with surrounding contusion. Left pneumothorax. Electronically Signed   By: Marlan Palauharles  Clark M.D.   On: 07/02/2017 14:48   Ct Abdomen Pelvis W Contrast  Result Date: 07/02/2017 CLINICAL DATA:  Fall from roof.  Trauma EXAM: CT CHEST, ABDOMEN, AND PELVIS WITH CONTRAST TECHNIQUE: Multidetector CT imaging of the chest, abdomen and pelvis was performed following the standard protocol during bolus administration of intravenous contrast. CONTRAST:  100mL ISOVUE-300 IOPAMIDOL (ISOVUE-300) INJECTION 61% COMPARISON:  None. FINDINGS: CT CHEST FINDINGS Cardiovascular: Heart size within normal limits. Thoracic aorta normal. Pulmonary artery is normal. Mediastinum/Nodes: Negative for pneumomediastinum or hemorrhage. No mass. Lungs/Pleura: Large left pneumothorax with collapse of most of the left lower lobe. Upper lobe emphysema bilaterally. Apical blebs bilaterally. Mild right lower lobe atelectasis. Negative for pleural effusion Musculoskeletal: Multiple left rib fractures. Left first rib fracture. Nondisplaced ribs left 3 through 7 ribs posteriorly and laterally. No displaced rib fractures. No thoracic vertebral fracture. CT ABDOMEN PELVIS FINDINGS Hepatobiliary: Normal liver and gallbladder. Pancreas:  Negative Spleen: Negative Adrenals/Urinary Tract: Negative Stomach/Bowel: No bowel obstruction or edema. Vascular/Lymphatic: Mild atherosclerotic disease in the aorta. Reproductive: Prostate and seminal vesicle enlargement bilaterally. Other: No free-fluid Musculoskeletal: Negative for lumbar or pelvic fracture. IMPRESSION: Multiple nondisplaced left rib fractures including the left first rib. Large left pneumothorax. Bibasilar atelectasis left greater than right. Apical emphysema and blebs bilaterally. No acute injury in the abdomen. Electronically Signed   By: Marlan Palauharles  Clark M.D.   On: 07/02/2017 14:55   Dg Chest Portable 1 View  Result Date: 07/02/2017 CLINICAL DATA:  Fall. Left-sided injury. Pain. Shortness of breath. EXAM: PORTABLE CHEST 1 VIEW COMPARISON:  None. FINDINGS: Mediastinum hilar structures normal. Bibasilar atelectasis. Small left-sided pneumothorax noted. No pleural effusion. Nondisplaced left 6th rib fracture cannot be excluded . IMPRESSION: Small left-sided pneumothorax. Nondisplaced left 6th rib fracture cannot be excluded. Critical Value/emergent results were called by telephone at the time of interpretation on 07/02/2017 at 12:35 pm to Dr. Azalia BilisKEVIN Kavina Cantave , who verbally acknowledged these results. Electronically Signed   By: Maisie Fushomas  Register   On: 07/02/2017 12:34    Procedures Procedures (including critical care time)   ++++++++++++++++++++++++++++++ SPLINT APPLICATION Authorized by: Azalia BilisKevin Myrtie Leuthold Consent: Verbal consent obtained. Risks and benefits: risks, benefits and alternatives were discussed Consent given by: patient Splint applied by: orthopedic technician Location details: left wrist Splint type: volar plate Supplies used: plaster Post-procedure: The splinted body part was neurovascularly unchanged following the procedure. Patient tolerance: Patient  tolerated the procedure well with no immediate complications. +++++++++++++++++++++++++++++++++++++    Medications  Ordered in ED Medications  HYDROmorphone (DILAUDID) injection 1 mg (1 mg Intravenous Given 07/02/17 1212)  iopamidol (ISOVUE-300) 61 % injection (100 mLs  Contrast Given 07/02/17 1405)  HYDROmorphone (DILAUDID) injection 1 mg (1 mg Intravenous Given 07/02/17 1349)  midazolam (VERSED) injection 2 mg (2 mg Intravenous Given 07/02/17 1432)     Initial Impression / Assessment and Plan / ED Course  I have reviewed the triage vital signs and the nursing notes.  Pertinent labs & imaging results that were available during my care of the patient were reviewed by me and considered in my medical decision making (see chart for details).     Stable left-sided traumatic pneumothorax.  Pigtail thoracostomy placed by trauma team.  Splint placed by orthopedic team.  Orthopedic consultation for distal left radius fracture.  Closed injury.  Final Clinical Impressions(s) / ED Diagnoses   Final diagnoses:  Pain  Fall  Closed fracture of multiple ribs of left side, initial encounter  Pneumothorax, left  Fall, initial encounter  Unspecified fracture of the lower end of left radius, initial encounter for closed fracture    ED Discharge Orders    None       Azalia Bilisampos, Nicklos Gaxiola, MD 07/02/17 1515

## 2017-07-02 NOTE — Consult Note (Signed)
Reason for Consult:Wrist fx Referring Physician: K Baker Simmons is an 46 y.o. male.  HPI: Billy Simmons was on a second-story roof cleaning gutters. He is amnestic to the event but thinks he must have slipped and fell to the ground. He was brought in as a level 2 trauma activation c/o left wrist pain, chest pain, and SOB. There was a probable LOC. He is ambidextrous but is primarily LHD.  History reviewed. No pertinent past medical history.  Past Surgical History:  Procedure Laterality Date  . EYE SURGERY      No family history on file.  Social History:  reports that he has quit smoking. he has never used smokeless tobacco. He reports that he drinks about 7.2 oz of alcohol per week. He reports that he uses drugs. Drug: Marijuana.  Allergies: No Known Allergies  Medications: I have reviewed the patient's current medications.  Results for orders placed or performed during the hospital encounter of 07/02/17 (from the past 48 hour(s))  CBC     Status: Abnormal   Collection Time: 07/02/17 12:10 PM  Result Value Ref Range   WBC 10.6 (H) 4.0 - 10.5 Billy/uL   RBC 4.83 4.22 - 5.81 MIL/uL   Hemoglobin 16.0 13.0 - 17.0 g/dL   HCT 46.7 39.0 - 52.0 %   MCV 96.7 78.0 - 100.0 fL   MCH 33.1 26.0 - 34.0 pg   MCHC 34.3 30.0 - 36.0 g/dL   RDW 12.8 11.5 - 15.5 %   Platelets 275 150 - 400 Billy/uL  Basic metabolic panel     Status: Abnormal   Collection Time: 07/02/17 12:10 PM  Result Value Ref Range   Sodium 139 135 - 145 mmol/L   Potassium 3.2 (L) 3.5 - 5.1 mmol/L   Chloride 102 101 - 111 mmol/L   CO2 28 22 - 32 mmol/L   Glucose, Bld 131 (H) 65 - 99 mg/dL   BUN 5 (L) 6 - 20 mg/dL   Creatinine, Ser 1.02 0.61 - 1.24 mg/dL   Calcium 9.1 8.9 - 10.3 mg/dL   GFR calc non Af Amer >60 >60 mL/min   GFR calc Af Amer >60 >60 mL/min    Comment: (NOTE) The eGFR has been calculated using the CKD EPI equation. This calculation has not been validated in all clinical situations. eGFR's persistently <60  mL/min signify possible Chronic Kidney Disease.    Anion gap 9 5 - 15    Dg Wrist Complete Left  Result Date: 07/02/2017 CLINICAL DATA:  24 or male with left wrist pain after fall from roof. EXAM: LEFT WRIST - COMPLETE 3+ VIEW COMPARISON:  None. FINDINGS: There is an acute, comminuted, closed and intra-articular fracture involving the distal left radius extending into the radiocarpal and distal radioulnar joints. There is dorsal angulation of the distal fracture fragments with dorsal displacement of the carpal rows. Approximately 2 mm of displacement is noted between the fracture fragments that involve the articulating surface of the radius as seen on the lateral view. A fracture of the base of the ulnar styloid is noted. No carpal bone fracture is visualized. IMPRESSION: 1. Acute, dorsally angulated and displaced distal radius fracture extending into the radiocarpal and distal radioulnar joints. 2. Nondisplaced ulnar styloid fracture. 3. Intact carpal bones. Electronically Signed   By: Ashley Royalty M.D.   On: 07/02/2017 12:40   Dg Chest Portable 1 View  Result Date: 07/02/2017 CLINICAL DATA:  Fall. Left-sided injury. Pain. Shortness of breath. EXAM: PORTABLE CHEST  1 VIEW COMPARISON:  None. FINDINGS: Mediastinum hilar structures normal. Bibasilar atelectasis. Small left-sided pneumothorax noted. No pleural effusion. Nondisplaced left 6th rib fracture cannot be excluded . IMPRESSION: Small left-sided pneumothorax. Nondisplaced left 6th rib fracture cannot be excluded. Critical Value/emergent results were called by telephone at the time of interpretation on 07/02/2017 at 12:35 pm to Dr. Jola Schmidt , who verbally acknowledged these results. Electronically Signed   By: Marcello Moores  Register   On: 07/02/2017 12:34    Review of Systems  Constitutional: Negative for weight loss.  HENT: Negative for ear discharge, ear pain, hearing loss and tinnitus.   Eyes: Negative for blurred vision, double vision,  photophobia and pain.  Respiratory: Positive for shortness of breath. Negative for cough and sputum production.   Cardiovascular: Negative for chest pain.  Gastrointestinal: Negative for abdominal pain, nausea and vomiting.  Genitourinary: Negative for dysuria, flank pain, frequency and urgency.  Musculoskeletal: Positive for joint pain (Left wrist). Negative for back pain, falls, myalgias and neck pain.  Neurological: Positive for loss of consciousness. Negative for dizziness, tingling, sensory change, focal weakness and headaches.  Endo/Heme/Allergies: Does not bruise/bleed easily.  Psychiatric/Behavioral: Positive for memory loss. Negative for depression and substance abuse. The patient is not nervous/anxious.    Blood pressure 111/79, pulse (!) 110, temperature 98.3 F (36.8 C), temperature source Temporal, resp. rate (!) 24, height '5\' 4"'$  (1.626 m), weight 61.2 kg (135 lb), SpO2 91 %. Physical Exam  Constitutional: He appears well-developed and well-nourished. No distress.  HENT:  Head: Normocephalic.  Eyes: Conjunctivae are normal. Right eye exhibits no discharge. Left eye exhibits no discharge. No scleral icterus.  Cardiovascular: Normal rate and regular rhythm.  Respiratory: He is in respiratory distress (Mild).  Musculoskeletal:  Right shoulder, elbow, wrist, digits- no skin wounds, nontender, no instability, no blocks to motion  Sens  Ax/R/M/U intact  Mot   Ax/ R/ PIN/ M/ AIN/ U intact  Rad 2+  Left shoulder, elbow, wrist, digits- no skin wounds, wrist TTP, in short arm splint  Sens  Ax/R/M/U intact  Mot   Ax/ R/ PIN/ M/ AIN/ U intact  Pelvis--no traumatic wounds or rash, no ecchymosis, stable to manual stress, nontender  BLE Small abrasion distal left leg, no ecchymosis or rash  Minimally TTP at ankle  No knee or ankle effusion  Knee stable to varus/ valgus and anterior/posterior stress  Sens DPN, SPN, TN intact  Motor EHL, ext, flex, evers 5/5  DP 2+, PT 2+, No  significant edema  Neurological: He is alert.  Skin: Skin is warm and dry. He is not diaphoretic.  Psychiatric: He has a normal mood and affect. His behavior is normal.    Assessment/Plan: Fall Left distal radius fx/ulnar styloid fx -- Will need ORIF. Will put in sugar tong splint. Tentative ORIF tomorrow with Dr. Doreatha Martin, may be moved later in week dependent on schedule. NWB.  Concussion (possibly worse) PTX +/- rib fxs  Trauma to admit pt.    Lisette Abu, PA-C Orthopedic Surgery [phone] 07/02/2017, 1:41 PM

## 2017-07-02 NOTE — ED Triage Notes (Signed)
Per [facility], Pt is coming from work where he fell off a roof, unknown if it was 12 or 20 feet. Pt landed on a wood deck. Pt was noted to be confused upon arrival. Has hematoma noted to his left eye and some bleeding to the left ear. Complains of back pain and has notable deformity to the left wrist. Pt alert at time of arrival to the ED.

## 2017-07-02 NOTE — Progress Notes (Signed)
   07/02/17 1300  Clinical Encounter Type  Visited With Patient;Health care provider  Visit Type ED  Referral From Nurse  Consult/Referral To Chaplain  Spiritual Encounters  Spiritual Needs Emotional   Responded to a Level II fall.  Patient arrived and I asked if he wanted me to call anyone.  Number he gave me for his mom was not working.  Patient at this time could not remember any other number to try.  Patient was being assessed by the medical team.  Will follow as needed. Chaplain Agustin CreeNewton Jacqueline Spofford

## 2017-07-02 NOTE — ED Notes (Signed)
Pt being taken to CT.

## 2017-07-02 NOTE — ED Notes (Signed)
MD Janee Mornhompson at the bedside performing Emergency Consent Pigtail Chest Tube.

## 2017-07-02 NOTE — H&P (Signed)
[facility] Surgery Consult/Admission Note  Billy Simmons 11/09/1970  [id number].    Requesting MD: Dr. Venora Maples Chief Complaint/Reason for Consult: level II trauma, fall from ladder  HPI:   Pt is a 46 year old male with no significant past medical history who was on a ladder roughly 2 stories up when he fell. He is not sure how he fell. He believes he had LOC. He presented as a level II trauma. He is complaining of constant, severe, non radiating left wrist and "lung" pain. Associated SOB. He denies abdominal pain, numbness, headache, visual changes. Previous smoker, drinks beer occasionally, smokes mariajuana occasionally. 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   ROS:  Review of Systems  Constitutional: Negative for fever.  HENT: Negative for hearing loss.   Eyes: Negative for blurred vision and double vision.  Respiratory: Positive for shortness of breath. Negative for cough.   Cardiovascular: Positive for chest pain (chest wall pain/rib pain).  Gastrointestinal: Negative for abdominal pain, nausea and vomiting.  Musculoskeletal: Positive for falls and joint pain (left wrist). Negative for back pain and neck pain.  Skin: Negative for rash.  Neurological: Positive for loss of consciousness. Negative for dizziness, sensory change, focal weakness and headaches.  All other systems reviewed and are negative.    No family history on file.  History reviewed. No pertinent past medical history.  Past Surgical History:  Procedure Laterality Date  . EYE SURGERY      Social History:  reports that he has quit smoking. he has never used smokeless tobacco. He reports that he drinks about 7.2 oz of alcohol per week. He reports that he uses drugs. Drug: Marijuana.  Allergies: No Known Allergies   (Not in a hospital admission)  Blood pressure 111/79, pulse (!) 110, temperature 98.3 F (36.8 C), temperature source Temporal, resp. rate (!) 24,  height _0  (1.626 m), weight 135 lb (61.2 kg), SpO2 91 %.  Physical Exam  Constitutional: He is oriented to person, place, and time and well-developed, well-nourished, and in no distress. No distress.  HENT:  Head: Normocephalic. Head is with abrasion.    Right Ear: Tympanic membrane and external ear normal.  Left Ear: Tympanic membrane normal.  Ears:  Nose: Nose normal.  Mouth/Throat: Oropharynx is clear and moist.  Abrasion noted to left outer ear  Eyes: Conjunctivae, EOM and lids are normal. Pupils are equal, round, and reactive to light.  Neck: Phonation normal. No spinous process tenderness and no muscular tenderness present.  c collar in place  Cardiovascular: Normal rate, regular rhythm, normal heart sounds and intact distal pulses. Exam reveals no gallop and no friction rub.  No murmur heard. Pulses:      Radial pulses are 2+ on the right side. Left radial pulse not accessible.       Posterior tibial pulses are 2+ on the right side, and 2+ on the left side.  Pulmonary/Chest: Effort normal. No respiratory distress. He has decreased breath sounds in the left upper field. He has no wheezes. He has no rhonchi. He has no rales.  [place] on place, on 2L O2 Mild TTP to mid chest wall, no deformities or ecchymosis noted  Abdominal: Soft. Normal appearance and bowel sounds are normal. He exhibits no distension. There is no hepatosplenomegaly. There is no tenderness.  Musculoskeletal: He exhibits tenderness.  Moves all extremities, left wrist in splint No edema. Abrasion to left knee. Abrasion and contusion noted to  left mid shin  Neurological: He is alert and oriented to person, place, and time. No cranial nerve deficit (grossly intact). GCS score is 15.  Skin: Skin is warm and dry. He is not diaphoretic.  Psychiatric: Mood and affect normal.  Nursing note and vitals reviewed.   Results for orders placed or performed during the hospital encounter of 07/02/17 (from the past 48  hour(s))  CBC     Status: Abnormal   Collection Time: 07/02/17 12:10 PM  Result Value Ref Range   WBC 10.6 (H) 4.0 - 10.5 K/uL   RBC 4.83 4.22 - 5.81 MIL/uL   Hemoglobin 16.0 13.0 - 17.0 g/dL   HCT 46.7 39.0 - 52.0 %   MCV 96.7 78.0 - 100.0 fL   MCH 33.1 26.0 - 34.0 pg   MCHC 34.3 30.0 - 36.0 g/dL   RDW 12.8 11.5 - 15.5 %   Platelets 275 150 - 400 K/uL  Basic metabolic panel     Status: Abnormal   Collection Time: 07/02/17 12:10 PM  Result Value Ref Range   Sodium 139 135 - 145 mmol/L   Potassium 3.2 (L) 3.5 - 5.1 mmol/L   Chloride 102 101 - 111 mmol/L   CO2 28 22 - 32 mmol/L   Glucose, Bld 131 (H) 65 - 99 mg/dL   BUN 5 (L) 6 - 20 mg/dL   Creatinine, Ser 1.02 0.61 - 1.24 mg/dL   Calcium 9.1 8.9 - 10.3 mg/dL   GFR calc non Af Amer >60 >60 mL/min   GFR calc Af Amer >60 >60 mL/min    Comment: (NOTE) The eGFR has been calculated using the CKD EPI equation. This calculation has not been validated in all clinical situations. eGFR's persistently <60 mL/min signify possible Chronic Kidney Disease.    Anion gap 9 5 - 15   Dg Wrist Complete Left  Result Date: 07/02/2017 CLINICAL DATA:  62 or male with left wrist pain after fall from roof. EXAM: LEFT WRIST - COMPLETE 3+ VIEW COMPARISON:  None. FINDINGS: There is an acute, comminuted, closed and intra-articular fracture involving the distal left radius extending into the radiocarpal and distal radioulnar joints. There is dorsal angulation of the distal fracture fragments with dorsal displacement of the carpal rows. Approximately 2 mm of displacement is noted between the fracture fragments that involve the articulating surface of the radius as seen on the lateral view. A fracture of the base of the ulnar styloid is noted. No carpal bone fracture is visualized. IMPRESSION: 1. Acute, dorsally angulated and displaced distal radius fracture extending into the radiocarpal and distal radioulnar joints. 2. Nondisplaced ulnar styloid fracture.  3. Intact carpal bones. Electronically Signed   By: Ashley Royalty M.D.   On: 07/02/2017 12:40   Dg Chest Portable 1 View  Result Date: 07/02/2017 CLINICAL DATA:  Fall. Left-sided injury. Pain. Shortness of breath. EXAM: PORTABLE CHEST 1 VIEW COMPARISON:  None. FINDINGS: Mediastinum hilar structures normal. Bibasilar atelectasis. Small left-sided pneumothorax noted. No pleural effusion. Nondisplaced left 6th rib fracture cannot be excluded . IMPRESSION: Small left-sided pneumothorax. Nondisplaced left 6th rib fracture cannot be excluded. Critical Value/emergent results were called by telephone at the time of interpretation on 07/02/2017 at 12:35 pm to Dr. Jola Schmidt , who verbally acknowledged these results. Electronically Signed   By: Marcello Moores  Register   On: 07/02/2017 12:34      Assessment/Plan  Fall from ladder Left sided PTX L sided rib fractures, 1sr rib and 3-7 - left sided chest  tube, continous pulse ox, 2L O2  [place] Left radial frx - OR tomorrow with [facility] for ORIF  Plan: clears, SDU, duo nebs, pulmonary toileting, Or tomorrow with Ortho for ORIF   Kalman Drape, [facility] 07/02/2017, 1:53 PM Pager: [phone] Consults: [phone] Mon-Fri 7:00 am-4:30 pm Sat-Sun 7:00 am-11:30 am

## 2017-07-02 NOTE — ED Notes (Signed)
Pt refusing CT at this time. Pt alert and oriented x4.

## 2017-07-02 NOTE — Progress Notes (Signed)
Orthopedic Tech Progress Note Patient Details:  Jaquelyn BitterJames B Courtois [birth year] [id number]  Ortho Devices Type of Ortho Device: Ace wrap, Long arm splint, Arm sling Ortho Device/Splint Location: LUE Ortho Device/Splint Interventions: Ordered, Application   Post Interventions Patient Tolerated: Well Instructions Provided: Care of device   Jennye MoccasinHughes, Zyion Leidner Craig 07/02/2017, 3:30 PM

## 2017-07-02 NOTE — ED Notes (Signed)
Xray at the bedside.

## 2017-07-02 NOTE — ED Notes (Signed)
Ortho PA at the bedside 

## 2017-07-02 NOTE — Progress Notes (Signed)
Orthopedic Tech Progress Note Patient Details:  Jaquelyn BitterJames B Mcilvain 26-Mar-1971 [id number]  Ortho Devices Type of Ortho Device: Ace wrap, Volar splint Ortho Device/Splint Location: lue Ortho Device/Splint Interventions: Application   Post Interventions Patient Tolerated: Well Instructions Provided: Care of device   Nikki DomCrawford, Jilliane Kazanjian 07/02/2017, 12:31 PM

## 2017-07-02 NOTE — ED Notes (Signed)
Pt taken to CT.

## 2017-07-02 NOTE — Procedures (Signed)
Chest Tube Insertion Procedure Note  Pre-operative Diagnosis: L pneumothorax after fall  Post-operative Diagnosis: L pneumothorax after fall  Surgeon: Billy GelinasBurke Demontray Franta, MD  Assistant: Billy MarlinJessica Focht, PA-C  Procedure Details  Emergency consent was obtained for the procedure.  After sterile skin prep, using standard technique, a 14 French tube was placed in the left anterior axillary line above the nipple level using Seldinger technique. Tube was hooked up to a Pleurevac. Sterile dressing applied. CXR P.  Findings: Rush of air  Estimated Blood Loss:  Minimal         Specimens:  None              Complications:  None; patient tolerated the procedure well.         Disposition: trauma bay         Condition: stable  Billy GelinasBurke Cadell Gabrielson, MD, MPH, FACS Trauma: [phone] General Surgery: [phone]

## 2017-07-02 NOTE — ED Notes (Signed)
Paged Ortho for long arm splint.

## 2017-07-03 ENCOUNTER — Encounter (HOSPITAL_COMMUNITY): Admission: EM | Disposition: A | Discharge status: Home / Self Care | Payer: Self-pay | Source: Home / Self Care

## 2017-07-03 ENCOUNTER — Inpatient Hospital Stay (HOSPITAL_COMMUNITY): Payer: Self-pay

## 2017-07-03 ENCOUNTER — Encounter (HOSPITAL_COMMUNITY): Payer: Self-pay | Admitting: *Deleted

## 2017-07-03 ENCOUNTER — Inpatient Hospital Stay (HOSPITAL_COMMUNITY): Payer: Self-pay | Admitting: Anesthesiology

## 2017-07-03 HISTORY — PX: ORIF WRIST FRACTURE: SHX2133

## 2017-07-03 LAB — COMPREHENSIVE METABOLIC PANEL
ALBUMIN: 3.2 g/dL — AB (ref 3.5–5.0)
ALK PHOS: 40 U/L (ref 38–126)
ALT: 10 U/L — ABNORMAL LOW (ref 17–63)
ANION GAP: 10 (ref 5–15)
AST: 30 U/L (ref 15–41)
BILIRUBIN TOTAL: 0.9 mg/dL (ref 0.3–1.2)
BUN: 6 mg/dL (ref 6–20)
CO2: 24 mmol/L (ref 22–32)
Calcium: 7.3 mg/dL — ABNORMAL LOW (ref 8.9–10.3)
Chloride: 101 mmol/L (ref 101–111)
Creatinine, Ser: 0.74 mg/dL (ref 0.61–1.24)
GFR calc Af Amer: >60 mL/min (ref >60)
GLUCOSE: 101 mg/dL — AB (ref 65–99)
Potassium: 2.9 mmol/L — ABNORMAL LOW (ref 3.5–5.1)
Sodium: 135 mmol/L (ref 135–145)
TOTAL PROTEIN: 5.3 g/dL — AB (ref 6.5–8.1)

## 2017-07-03 LAB — CBC
HEMATOCRIT: 41.8 % (ref 39.0–52.0)
HEMOGLOBIN: 14.2 g/dL (ref 13.0–17.0)
MCH: 33.1 pg (ref 26.0–34.0)
MCHC: 34 g/dL (ref 30.0–36.0)
MCV: 97.4 fL (ref 78.0–100.0)
Platelets: 209 10*3/uL (ref 150–400)
RBC: 4.29 MIL/uL (ref 4.22–5.81)
RDW: 13.2 % (ref 11.5–15.5)
WBC: 7.8 10*3/uL (ref 4.0–10.5)

## 2017-07-03 LAB — HIV ANTIBODY (ROUTINE TESTING W REFLEX): HIV SCREEN 4TH GENERATION: NONREACTIVE

## 2017-07-03 LAB — SURGICAL PCR SCREEN
MRSA, PCR: NEGATIVE
STAPHYLOCOCCUS AUREUS: NEGATIVE

## 2017-07-03 LAB — MRSA PCR SCREENING: MRSA by PCR: NEGATIVE

## 2017-07-03 SURGERY — OPEN REDUCTION INTERNAL FIXATION (ORIF) WRIST FRACTURE
Anesthesia: Regional | Site: Wrist | Laterality: Left

## 2017-07-03 MED ORDER — PROPOFOL 10 MG/ML IV BOLUS
INTRAVENOUS | Status: AC
  Filled 2017-07-03: qty 20

## 2017-07-03 MED ORDER — LIDOCAINE 2% (20 MG/ML) 5 ML SYRINGE
INTRAMUSCULAR | Status: AC
  Filled 2017-07-03: qty 5

## 2017-07-03 MED ORDER — HYDROMORPHONE HCL 1 MG/ML IJ SOLN: 0.2500 mg | INTRAMUSCULAR | Status: DC | PRN

## 2017-07-03 MED ORDER — PHENYLEPHRINE 40 MCG/ML (10ML) SYRINGE FOR IV PUSH (FOR BLOOD PRESSURE SUPPORT)
PREFILLED_SYRINGE | INTRAVENOUS | Status: AC
  Filled 2017-07-03: qty 10

## 2017-07-03 MED ORDER — METHOCARBAMOL 1000 MG/10ML IJ SOLN
500.0000 mg | Freq: Three times a day (TID) | INTRAVENOUS | Status: DC
  Administered 2017-07-03 – 2017-07-04 (×4): 500 mg via INTRAVENOUS
  Filled 2017-07-03: qty 5
  Filled 2017-07-03 (×2): qty 550
  Filled 2017-07-03: qty 5
  Filled 2017-07-03: qty 550

## 2017-07-03 MED ORDER — BACITRACIN ZINC 500 UNIT/GM EX OINT
TOPICAL_OINTMENT | CUTANEOUS | Status: DC | PRN
  Administered 2017-07-03: 1 via TOPICAL

## 2017-07-03 MED ORDER — HYDROMORPHONE HCL 1 MG/ML IJ SOLN
0.5000 mg | INTRAMUSCULAR | Status: DC | PRN
  Administered 2017-07-03 – 2017-07-04 (×3): 0.5 mg via INTRAVENOUS
  Filled 2017-07-03 (×3): qty 0.5

## 2017-07-03 MED ORDER — MIDAZOLAM HCL 2 MG/2ML IJ SOLN
INTRAMUSCULAR | Status: AC
  Filled 2017-07-03: qty 2

## 2017-07-03 MED ORDER — ROPIVACAINE HCL 7.5 MG/ML IJ SOLN
INTRAMUSCULAR | Status: DC | PRN
  Administered 2017-07-03: 20 mL via PERINEURAL

## 2017-07-03 MED ORDER — PROPOFOL 10 MG/ML IV BOLUS
INTRAVENOUS | Status: DC | PRN
  Administered 2017-07-03: 200 mg via INTRAVENOUS

## 2017-07-03 MED ORDER — PHENYLEPHRINE 40 MCG/ML (10ML) SYRINGE FOR IV PUSH (FOR BLOOD PRESSURE SUPPORT)
PREFILLED_SYRINGE | INTRAVENOUS | Status: DC | PRN
  Administered 2017-07-03: 120 ug via INTRAVENOUS
  Administered 2017-07-03 (×2): 80 ug via INTRAVENOUS

## 2017-07-03 MED ORDER — ONDANSETRON HCL 4 MG/2ML IJ SOLN
INTRAMUSCULAR | Status: AC
  Filled 2017-07-03: qty 2

## 2017-07-03 MED ORDER — 0.9 % SODIUM CHLORIDE (POUR BTL) OPTIME
TOPICAL | Status: DC | PRN
  Administered 2017-07-03: 1000 mL

## 2017-07-03 MED ORDER — POTASSIUM CHLORIDE 10 MEQ/50ML IV SOLN
10.0000 meq | INTRAVENOUS | Status: AC
  Administered 2017-07-03 (×6): 10 meq via INTRAVENOUS
  Filled 2017-07-03 (×6): qty 50

## 2017-07-03 MED ORDER — IPRATROPIUM-ALBUTEROL 0.5-2.5 (3) MG/3ML IN SOLN: 3.0000 mL | Freq: Four times a day (QID) | RESPIRATORY_TRACT | Status: DC | PRN

## 2017-07-03 MED ORDER — OXYCODONE HCL 5 MG PO TABS
5.0000 mg | ORAL_TABLET | Freq: Once | ORAL | Status: DC | PRN
Start: 2017-07-03 — End: 2017-07-03

## 2017-07-03 MED ORDER — VANCOMYCIN HCL 1000 MG IV SOLR
INTRAVENOUS | Status: AC
  Filled 2017-07-03: qty 1000

## 2017-07-03 MED ORDER — MIDAZOLAM HCL 2 MG/2ML IJ SOLN
INTRAMUSCULAR | Status: DC | PRN
  Administered 2017-07-03: 1 mg via INTRAVENOUS

## 2017-07-03 MED ORDER — LACTATED RINGERS IV SOLN
INTRAVENOUS | Status: DC
  Administered 2017-07-03: 17:00:00 via INTRAVENOUS

## 2017-07-03 MED ORDER — FENTANYL CITRATE (PF) 100 MCG/2ML IJ SOLN
INTRAMUSCULAR | Status: AC
  Filled 2017-07-03: qty 2

## 2017-07-03 MED ORDER — DEXAMETHASONE SODIUM PHOSPHATE 10 MG/ML IJ SOLN
INTRAMUSCULAR | Status: DC | PRN
  Administered 2017-07-03: 10 mg via INTRAVENOUS

## 2017-07-03 MED ORDER — PROMETHAZINE HCL 25 MG/ML IJ SOLN: 6.2500 mg | INTRAMUSCULAR | Status: DC | PRN

## 2017-07-03 MED ORDER — ONDANSETRON HCL 4 MG/2ML IJ SOLN
INTRAMUSCULAR | Status: DC | PRN
  Administered 2017-07-03: 4 mg via INTRAVENOUS

## 2017-07-03 MED ORDER — OXYCODONE HCL 5 MG/5ML PO SOLN: 5.0000 mg | Freq: Once | ORAL | Status: DC | PRN

## 2017-07-03 MED ORDER — VANCOMYCIN HCL 1000 MG IV SOLR
INTRAVENOUS | Status: DC | PRN
  Administered 2017-07-03: 1000 mg via TOPICAL

## 2017-07-03 MED ORDER — ROCURONIUM BROMIDE 10 MG/ML (PF) SYRINGE
PREFILLED_SYRINGE | INTRAVENOUS | Status: AC
  Filled 2017-07-03: qty 5

## 2017-07-03 MED ORDER — FENTANYL CITRATE (PF) 250 MCG/5ML IJ SOLN
INTRAMUSCULAR | Status: AC
  Filled 2017-07-03: qty 5

## 2017-07-03 MED ORDER — BACITRACIN ZINC 500 UNIT/GM EX OINT
TOPICAL_OINTMENT | CUTANEOUS | Status: AC
  Filled 2017-07-03: qty 28.35

## 2017-07-03 MED ORDER — ENOXAPARIN SODIUM 40 MG/0.4ML ~~LOC~~ SOLN
40.0000 mg | SUBCUTANEOUS | Status: DC
  Administered 2017-07-04 – 2017-07-08 (×5): 40 mg via SUBCUTANEOUS
  Filled 2017-07-03 (×5): qty 0.4

## 2017-07-03 MED ORDER — DEXAMETHASONE SODIUM PHOSPHATE 10 MG/ML IJ SOLN
INTRAMUSCULAR | Status: AC
  Filled 2017-07-03: qty 1

## 2017-07-03 SURGICAL SUPPLY — 61 items
BANDAGE ACE 4X5 VEL STRL LF (GAUZE/BANDAGES/DRESSINGS) ×3 IMPLANT
BIT DRILL 2.2 SS TIBIAL (BIT) ×2 IMPLANT
BLADE CLIPPER SURG (BLADE) ×3 IMPLANT
BNDG CMPR 9X4 STRL LF SNTH (GAUZE/BANDAGES/DRESSINGS) ×1
BNDG COHESIVE 4X5 TAN STRL (GAUZE/BANDAGES/DRESSINGS) ×2 IMPLANT
BNDG ESMARK 4X9 LF (GAUZE/BANDAGES/DRESSINGS) ×3 IMPLANT
BRUSH SCRUB SURG 4.25 DISP (MISCELLANEOUS) ×6 IMPLANT
COVER SURGICAL LIGHT HANDLE (MISCELLANEOUS) ×3 IMPLANT
DECANTER SPIKE VIAL GLASS SM (MISCELLANEOUS) IMPLANT
DRAPE C-ARM 42X72 X-RAY (DRAPES) ×3 IMPLANT
DRSG EMULSION OIL 3X3 NADH (GAUZE/BANDAGES/DRESSINGS) ×3 IMPLANT
ELECT REM PT RETURN 9FT ADLT (ELECTROSURGICAL) ×3
ELECTRODE REM PT RTRN 9FT ADLT (ELECTROSURGICAL) ×1 IMPLANT
GAUZE SPONGE 4X4 12PLY STRL (GAUZE/BANDAGES/DRESSINGS) ×3 IMPLANT
GLOVE BIO SURGEON STRL SZ7.5 (GLOVE) ×5 IMPLANT
GLOVE BIO SURGEON STRL SZ8 (GLOVE) ×3 IMPLANT
GLOVE BIOGEL PI IND STRL 7.5 (GLOVE) ×1 IMPLANT
GLOVE BIOGEL PI IND STRL 8 (GLOVE) ×1 IMPLANT
GLOVE BIOGEL PI INDICATOR 7.5 (GLOVE) ×2
GLOVE BIOGEL PI INDICATOR 8 (GLOVE) ×2
GOWN STRL REUS W/ TWL LRG LVL3 (GOWN DISPOSABLE) ×2 IMPLANT
GOWN STRL REUS W/ TWL XL LVL3 (GOWN DISPOSABLE) ×1 IMPLANT
GOWN STRL REUS W/TWL LRG LVL3 (GOWN DISPOSABLE) ×3
GOWN STRL REUS W/TWL XL LVL3 (GOWN DISPOSABLE) ×3
K-WIRE 1.6 (WIRE) ×12
K-WIRE FX5X1.6XNS BN SS (WIRE) ×4
KIT BASIN OR (CUSTOM PROCEDURE TRAY) ×3 IMPLANT
KIT ROOM TURNOVER OR (KITS) ×3 IMPLANT
KWIRE FX5X1.6XNS BN SS (WIRE) IMPLANT
NDL HYPO 25GX1X1/2 BEV (NEEDLE) IMPLANT
NEEDLE HYPO 25GX1X1/2 BEV (NEEDLE) IMPLANT
NS IRRIG 1000ML POUR BTL (IV SOLUTION) ×3 IMPLANT
PACK ORTHO EXTREMITY (CUSTOM PROCEDURE TRAY) ×3 IMPLANT
PAD ARMBOARD 7.5X6 YLW CONV (MISCELLANEOUS) ×6 IMPLANT
PAD CAST 3X4 CTTN HI CHSV (CAST SUPPLIES) ×1 IMPLANT
PAD CAST 4YDX4 CTTN HI CHSV (CAST SUPPLIES) IMPLANT
PADDING CAST COTTON 3X4 STRL (CAST SUPPLIES) ×3
PADDING CAST COTTON 4X4 STRL (CAST SUPPLIES) ×3
PLATE STANDARD DVR LEFT (Plate) ×3 IMPLANT
PLATE STD DVR LT 24X51 (Plate) IMPLANT
SCREW LOCK 16X2.7X 3 LD TPR (Screw) IMPLANT
SCREW LOCK 18X2.7X 3 LD TPR (Screw) IMPLANT
SCREW LOCK 20X2.7X 3 LD TPR (Screw) IMPLANT
SCREW LOCK 22X2.7X 3 LD TPR (Screw) IMPLANT
SCREW LOCKING 2.7X16 (Screw) ×6 IMPLANT
SCREW LOCKING 2.7X18 (Screw) ×9 IMPLANT
SCREW LOCKING 2.7X20MM (Screw) ×6 IMPLANT
SCREW LOCKING 2.7X22MM (Screw) ×6 IMPLANT
SPLINT PLASTER CAST XFAST 4X15 (CAST SUPPLIES) IMPLANT
SPLINT PLASTER XTRA FAST SET 4 (CAST SUPPLIES) ×2
SUT ETHILON 3 0 PS 1 (SUTURE) ×4 IMPLANT
SUT VIC AB 0 CT1 27 (SUTURE) ×3
SUT VIC AB 0 CT1 27XBRD ANBCTR (SUTURE) ×2 IMPLANT
SUT VIC AB 2-0 CT1 27 (SUTURE) ×3
SUT VIC AB 2-0 CT1 TAPERPNT 27 (SUTURE) IMPLANT
SYR CONTROL 10ML LL (SYRINGE) IMPLANT
TOWEL OR 17X24 6PK STRL BLUE (TOWEL DISPOSABLE) ×3 IMPLANT
TOWEL OR 17X26 10 PK STRL BLUE (TOWEL DISPOSABLE) ×6 IMPLANT
TUBE CONNECTING 12'X1/4 (SUCTIONS) ×1
TUBE CONNECTING 12X1/4 (SUCTIONS) ×2 IMPLANT
UNDERPAD 30X30 (UNDERPADS AND DIAPERS) ×3 IMPLANT

## 2017-07-03 NOTE — Anesthesia Preprocedure Evaluation (Addendum)
Anesthesia Evaluation  Patient identified by MRN, date of birth, ID band Patient awake    Reviewed: Allergy & Precautions, NPO status , Patient's Chart, lab work & pertinent test results  Airway Mallampati: II  TM Distance: >3 FB Neck ROM: Full    Dental  (+) Lower Dentures, Upper Dentures   Pulmonary former smoker,  Left pneumothorax s/p chest tube Rib fractures   Pulmonary exam normal breath sounds clear to auscultation       Cardiovascular negative cardio ROS Normal cardiovascular exam Rhythm:Regular Rate:Normal  ECG: ST, rate 101   Neuro/Psych negative neurological ROS  negative psych ROS   GI/Hepatic negative GI ROS, (+)     substance abuse  ,   Endo/Other  negative endocrine ROS  Renal/GU negative Renal ROS     Musculoskeletal Left Wrist Fx Left hand numb and tingling per patient   Abdominal   Peds  Hematology negative hematology ROS (+)   Anesthesia Other Findings Left Wrist Fx Hypokalemic Left eye abrasion  Reproductive/Obstetrics                            Anesthesia Physical Anesthesia Plan  ASA: II  Anesthesia Plan: General and Regional   Post-op Pain Management: GA combined w/ Regional for post-op pain   Induction: Intravenous  PONV Risk Score and Plan: 2 and Dexamethasone, Ondansetron and Midazolam  Airway Management Planned: LMA  Additional Equipment:   Intra-op Plan:   Post-operative Plan: Extubation in OR  Informed Consent: I have reviewed the patients History and Physical, chart, labs and discussed the procedure including the risks, benefits and alternatives for the proposed anesthesia with the patient or authorized representative who has indicated his/her understanding and acceptance.   Dental advisory given  Plan Discussed with: CRNA  Anesthesia Plan Comments:        Anesthesia Quick Evaluation

## 2017-07-03 NOTE — Anesthesia Procedure Notes (Signed)
Anesthesia Regional Block: Supraclavicular block   Pre-Anesthetic Checklist: ,, timeout performed, Correct Patient, Correct Site, Correct Laterality, Correct Procedure,, site marked, risks and benefits discussed, Surgical consent,  Pre-op evaluation,  At surgeon's request and post-op pain management  Laterality: Left  Prep: chloraprep       Needles:  Injection technique: Single-shot  Needle Type: Echogenic Stimulator Needle     Needle Length: 9cm  Needle Gauge: 21     Additional Needles:   Procedures:,,,, ultrasound used (permanent image in chart),,,,  Narrative:  Start time: 07/03/2017 5:05 PM End time: 07/03/2017 5:15 PM Injection made incrementally with aspirations every 5 mL.  Performed by: Personally  Anesthesiologist: Leonides GrillsEllender, Ryan P, MD  Additional Notes: Functioning IV was confirmed and monitors were applied.  A 90mm 21ga Arrow echogenic stimulator needle was used. Sterile prep, hand hygiene and sterile gloves were used.  Negative aspiration and negative test dose prior to incremental administration of local anesthetic. The patient tolerated the procedure well.

## 2017-07-03 NOTE — Anesthesia Postprocedure Evaluation (Signed)
Anesthesia Post Note  Patient: Billy Simmons  Procedure(s) Performed: OPEN REDUCTION INTERNAL FIXATION (ORIF) WRIST FRACTURE (Left Wrist)     Patient location during evaluation: PACU Anesthesia Type: Regional and General Level of consciousness: awake and alert Pain management: pain level controlled Vital Signs Assessment: post-procedure vital signs reviewed and stable Respiratory status: spontaneous breathing, nonlabored ventilation, respiratory function stable and patient connected to nasal cannula oxygen Cardiovascular status: blood pressure returned to baseline and stable Postop Assessment: no apparent nausea or vomiting Anesthetic complications: no    Last Vitals:  Vitals:   07/03/17 1937 07/03/17 1940  BP: 116/78 112/74  Pulse: 87 90  Resp: 13 12  Temp:  36.4 C  SpO2: 93% 95%    Last Pain:  Vitals:   07/03/17 1940  TempSrc:   PainSc: Asleep                 Alwin Lanigan S

## 2017-07-03 NOTE — Transfer of Care (Signed)
Immediate Anesthesia Transfer of Care Note  Patient: Billy Simmons  Procedure(s) Performed: OPEN REDUCTION INTERNAL FIXATION (ORIF) WRIST FRACTURE (Left Wrist)  Patient Location: PACU  Anesthesia Type:GA combined with regional for post-op pain  Level of Consciousness: awake and alert   Airway & Oxygen Therapy: Patient Spontanous Breathing and Patient connected to nasal cannula oxygen  Post-op Assessment: Report given to RN and Post -op Vital signs reviewed and stable  Post vital signs: Reviewed and stable  Last Vitals:  Vitals:   07/03/17 1400 07/03/17 1926  BP: 120/83 (!) 142/85  Pulse: 92 (!) 115  Resp: 20 (!) 21  Temp:  36.7 C  SpO2: 94% 94%    Last Pain:  Vitals:   07/03/17 1926  TempSrc:   PainSc: (P) Asleep      Patients Stated Pain Goal: 3 (07/03/17 1450)  Complications: No apparent anesthesia complications

## 2017-07-03 NOTE — Anesthesia Procedure Notes (Signed)
Procedure Name: LMA Insertion Date/Time: 07/03/2017 5:53 PM Performed by: De Nurseennie, Kama Cammarano E, CRNA Pre-anesthesia Checklist: Patient identified, Emergency Drugs available, Suction available and Patient being monitored Patient Re-evaluated:Patient Re-evaluated prior to induction Oxygen Delivery Method: Circle System Utilized Preoxygenation: Pre-oxygenation with 100% oxygen Induction Type: IV induction Ventilation: Mask ventilation without difficulty LMA: LMA inserted LMA Size: 4.0 Number of attempts: 1 Placement Confirmation: positive ETCO2 Tube secured with: Tape Dental Injury: Teeth and Oropharynx as per pre-operative assessment

## 2017-07-03 NOTE — Progress Notes (Signed)
[facility] Progress Note     Subjective: CC: pain in left chest  Patient with pain in left chest, medication helps but dose of dilaudid is too strong. Patient pulling 1000 on IS. Wanting to know when he'll be able to get home, he is concerned about his length of stay because he does not have insurance. Denies nausea, abdominal pain, numbness/tingling.  UOP good. VSS.   Objective: Vital signs in last 24 hours: Temp:  [97.7 F (36.5 C)-98.3 F (36.8 C)] 97.7 F (36.5 C) (12/05 0315) Pulse Rate:  [85-110] 99 (12/05 0600) Resp:  [13-27] 17 (12/05 0600) BP: (101-136)/(72-89) 118/78 (12/05 0600) SpO2:  [87 %-99 %] 99 % (12/05 0600) Weight:  [61.2 kg (135 lb)] 61.2 kg (135 lb) (12/04 1232) Last BM Date: 07/02/17  Intake/Output from previous day: 12/04 0701 - 12/05 0700 In: 840 [P.O.:240; I.V.:600] Out: 1000 [Urine:1000] Intake/Output this shift: No intake/output data recorded.  PE: Gen:  Alert, NAD, pleasant Card:  Regular rate and rhythm, pedal pulses 2+ BL Pulm:  Normal effort, clear to auscultation bilaterally, left CT with no air leak Abd: Soft, non-tender, non-distended, bowel sounds present  Ext: LUE splinted, fingers WWP, sensation/motor intact; ROM grossly intact in RUE and BL LE Skin: warm and dry, no rashes  Psych: A&Ox3   Lab Results:  Recent Labs    07/02/17 1631 07/03/17 0415  WBC 13.5* 7.8  HGB 15.6 14.2  HCT 44.9 41.8  PLT 226 209   BMET Recent Labs    07/02/17 1210 07/02/17 1631 07/03/17 0415  NA 139  --  135  K 3.2*  --  2.9*  CL 102  --  101  CO2 28  --  24  GLUCOSE 131*  --  101*  BUN 5*  --  6  CREATININE 1.02 0.86 0.74  CALCIUM 9.1  --  7.3*   PT/INR No results for input(s): LABPROT, INR in the last 72 hours. CMP     Component Value Date/Time   NA 135 07/03/2017 0415   K 2.9 (L) 07/03/2017 0415   CL 101 07/03/2017 0415   CO2 24 07/03/2017 0415   GLUCOSE 101 (H) 07/03/2017 0415   BUN 6 07/03/2017 0415   CREATININE  0.74 07/03/2017 0415   CALCIUM 7.3 (L) 07/03/2017 0415   PROT 5.3 (L) 07/03/2017 0415   ALBUMIN 3.2 (L) 07/03/2017 0415   AST 30 07/03/2017 0415   ALT 10 (L) 07/03/2017 0415   ALKPHOS 40 07/03/2017 0415   BILITOT 0.9 07/03/2017 0415   GFRNONAA >60 07/03/2017 0415   GFRAA >60 07/03/2017 0415   Lipase  No results found for: LIPASE     Studies/Results: Dg Wrist Complete Left  Result Date: 07/02/2017 CLINICAL DATA:  20Forty-six or male with left wrist pain after fall from roof. EXAM: LEFT WRIST - COMPLETE 3+ VIEW COMPARISON:  None. FINDINGS: There is an acute, comminuted, closed and intra-articular fracture involving the distal left radius extending into the radiocarpal and distal radioulnar joints. There is dorsal angulation of the distal fracture fragments with dorsal displacement of the carpal rows. Approximately 2 mm of displacement is noted between the fracture fragments that involve the articulating surface of the radius as seen on the lateral view. A fracture of the base of the ulnar styloid is noted. No carpal bone fracture is visualized. IMPRESSION: 1. Acute, dorsally angulated and displaced distal radius fracture extending into the radiocarpal and distal radioulnar joints. 2. Nondisplaced ulnar styloid fracture. 3. Intact carpal bones.  Electronically Signed   By: Tollie Eth M.D.   On: 07/02/2017 12:40   Ct Head Wo Contrast  Result Date: 07/02/2017 CLINICAL DATA:  Fall from roof EXAM: CT HEAD WITHOUT CONTRAST CT CERVICAL SPINE WITHOUT CONTRAST TECHNIQUE: Multidetector CT imaging of the head and cervical spine was performed following the standard protocol without intravenous contrast. Multiplanar CT image reconstructions of the cervical spine were also generated. COMPARISON:  None. FINDINGS: CT HEAD FINDINGS Brain: No evidence of acute infarction, hemorrhage, hydrocephalus, extra-axial collection or mass lesion/mass effect. Vascular: Negative Skull: Negative Sinuses/Orbits: Mild mucosal  edema maxillary sinuses bilaterally. Normal orbit. Other: None CT CERVICAL SPINE FINDINGS Alignment: Normal Skull base and vertebrae: Negative for cervical spine fracture Soft tissues and spinal canal: Negative Disc levels: Mild disc degeneration. Small central disc protrusion C5-6 and C6-7 Upper chest: Mildly displaced fracture left first rib. Mild soft tissue contusion above the left lung apex likely due to mild bleeding. Left pneumothorax. Apical blebs bilaterally. Other: None IMPRESSION: 1. Negative CT head 2. Negative for cervical spine fracture 3. Fracture left first rib with surrounding contusion. Left pneumothorax. Electronically Signed   By: Marlan Palau M.D.   On: 07/02/2017 14:48   Ct Chest W Contrast  Result Date: 07/02/2017 CLINICAL DATA:  Fall from roof.  Trauma EXAM: CT CHEST, ABDOMEN, AND PELVIS WITH CONTRAST TECHNIQUE: Multidetector CT imaging of the chest, abdomen and pelvis was performed following the standard protocol during bolus administration of intravenous contrast. CONTRAST:  ISOVUE-300 IOPAMIDOL (ISOVUE-300) INJECTION 61% COMPARISON:  None. FINDINGS: CT CHEST FINDINGS Cardiovascular: Heart size within normal limits. Thoracic aorta normal. Pulmonary artery is normal. Mediastinum/Nodes: Negative for pneumomediastinum or hemorrhage. No mass. Lungs/Pleura: Large left pneumothorax with collapse of most of the left lower lobe. Upper lobe emphysema bilaterally. Apical blebs bilaterally. Mild right lower lobe atelectasis. Negative for pleural effusion Musculoskeletal: Multiple left rib fractures. Left first rib fracture. Nondisplaced ribs left 3 through 7 ribs posteriorly and laterally. No displaced rib fractures. No thoracic vertebral fracture. CT ABDOMEN PELVIS FINDINGS Hepatobiliary: Normal liver and gallbladder. Pancreas: Negative Spleen: Negative Adrenals/Urinary Tract: Negative Stomach/Bowel: No bowel obstruction or edema. Vascular/Lymphatic: Mild atherosclerotic disease in the  aorta. Reproductive: Prostate and seminal vesicle enlargement bilaterally. Other: No free-fluid Musculoskeletal: Negative for lumbar or pelvic fracture. IMPRESSION: Multiple nondisplaced left rib fractures including the left first rib. Large left pneumothorax. Bibasilar atelectasis left greater than right. Apical emphysema and blebs bilaterally. No acute injury in the abdomen. Electronically Signed   By: Marlan Palau M.D.   On: 07/02/2017 14:55   Ct Cervical Spine Wo Contrast  Result Date: 07/02/2017 CLINICAL DATA:  Fall from roof EXAM: CT HEAD WITHOUT CONTRAST CT CERVICAL SPINE WITHOUT CONTRAST TECHNIQUE: Multidetector CT imaging of the head and cervical spine was performed following the standard protocol without intravenous contrast. Multiplanar CT image reconstructions of the cervical spine were also generated. COMPARISON:  None. FINDINGS: CT HEAD FINDINGS Brain: No evidence of acute infarction, hemorrhage, hydrocephalus, extra-axial collection or mass lesion/mass effect. Vascular: Negative Skull: Negative Sinuses/Orbits: Mild mucosal edema maxillary sinuses bilaterally. Normal orbit. Other: None CT CERVICAL SPINE FINDINGS Alignment: Normal Skull base and vertebrae: Negative for cervical spine fracture Soft tissues and spinal canal: Negative Disc levels: Mild disc degeneration. Small central disc protrusion C5-6 and C6-7 Upper chest: Mildly displaced fracture left first rib. Mild soft tissue contusion above the left lung apex likely due to mild bleeding. Left pneumothorax. Apical blebs bilaterally. Other: None IMPRESSION: 1. Negative CT head 2. Negative for cervical  spine fracture 3. Fracture left first rib with surrounding contusion. Left pneumothorax. Electronically Signed   By: Marlan Palauharles  Clark M.D.   On: 07/02/2017 14:48   Ct Abdomen Pelvis W Contrast  Result Date: 07/02/2017 CLINICAL DATA:  Fall from roof.  Trauma EXAM: CT CHEST, ABDOMEN, AND PELVIS WITH CONTRAST TECHNIQUE: Multidetector CT imaging  of the chest, abdomen and pelvis was performed following the standard protocol during bolus administration of intravenous contrast. CONTRAST:  100mL ISOVUE-300 IOPAMIDOL (ISOVUE-300) INJECTION 61% COMPARISON:  None. FINDINGS: CT CHEST FINDINGS Cardiovascular: Heart size within normal limits. Thoracic aorta normal. Pulmonary artery is normal. Mediastinum/Nodes: Negative for pneumomediastinum or hemorrhage. No mass. Lungs/Pleura: Large left pneumothorax with collapse of most of the left lower lobe. Upper lobe emphysema bilaterally. Apical blebs bilaterally. Mild right lower lobe atelectasis. Negative for pleural effusion Musculoskeletal: Multiple left rib fractures. Left first rib fracture. Nondisplaced ribs left 3 through 7 ribs posteriorly and laterally. No displaced rib fractures. No thoracic vertebral fracture. CT ABDOMEN PELVIS FINDINGS Hepatobiliary: Normal liver and gallbladder. Pancreas: Negative Spleen: Negative Adrenals/Urinary Tract: Negative Stomach/Bowel: No bowel obstruction or edema. Vascular/Lymphatic: Mild atherosclerotic disease in the aorta. Reproductive: Prostate and seminal vesicle enlargement bilaterally. Other: No free-fluid Musculoskeletal: Negative for lumbar or pelvic fracture. IMPRESSION: Multiple nondisplaced left rib fractures including the left first rib. Large left pneumothorax. Bibasilar atelectasis left greater than right. Apical emphysema and blebs bilaterally. No acute injury in the abdomen. Electronically Signed   By: Marlan Palauharles  Clark M.D.   On: 07/02/2017 14:55   Ct Wrist Left Wo Contrast  Result Date: 07/02/2017 CLINICAL DATA:  Wrist pain after fall from roof today. Known fracture on radiographs. Splinted. EXAM: CT OF THE LEFT WRIST WITHOUT CONTRAST TECHNIQUE: Multidetector CT imaging was performed according to the standard protocol. Multiplanar CT image reconstructions were also generated. COMPARISON:  Radiographs from earlier on the same day. FINDINGS: Bones/Joint/Cartilage  Fine bony detail is limited by overlying splint. Acute, closed, intra-articular comminuted fracture of the distal radius extending into the radiocarpal joint at the level of the lunate hand with extension into the distal radioulnar joint is again demonstrated. Interval decrease in dorsal angulation of the distal fracture fragment, now estimated at 30 degrees of dorsal angulation versus at least 64 degrees previously on the radiographs. Nondisplaced ulnar styloid fracture. Carpal rows are maintained. Ligaments Suboptimally assessed by CT. Muscles and Tendons No atrophy or intramuscular hemorrhage. Soft tissues Negative IMPRESSION: 1. Decrease in dorsal angulation of a comminuted intra-articular fracture of the distal radius extending into the radiocarpal and distal radioulnar joint. The degree of dorsal angulation is estimated at 30 degrees currently versus 64 degrees previously. 2. Nondisplaced ulnar styloid fracture. Electronically Signed   By: Tollie Ethavid  Kwon M.D.   On: 07/02/2017 18:53   Dg Chest Port 1 View  Result Date: 07/02/2017 CLINICAL DATA:  Pneumothorax. EXAM: PORTABLE CHEST 1 VIEW COMPARISON:  CT chest and chest x-ray from same day. FINDINGS: Interval placement of a left-sided chest tube. No significant residual left-sided pneumothorax. The cardiomediastinal silhouette is normal in size. Bibasilar atelectasis. No consolidation or pleural effusion. Unchanged left-sided rib fractures. IMPRESSION: Interval placement of a left-sided chest tube. No significant residual left-sided pneumothorax. Electronically Signed   By: Obie DredgeWilliam T Derry M.D.   On: 07/02/2017 15:25   Dg Chest Portable 1 View  Result Date: 07/02/2017 CLINICAL DATA:  Fall. Left-sided injury. Pain. Shortness of breath. EXAM: PORTABLE CHEST 1 VIEW COMPARISON:  None. FINDINGS: Mediastinum hilar structures normal. Bibasilar atelectasis. Small left-sided pneumothorax noted. No pleural  effusion. Nondisplaced left 6th rib fracture cannot be  excluded . IMPRESSION: Small left-sided pneumothorax. Nondisplaced left 6th rib fracture cannot be excluded. Critical Value/emergent results were called by telephone at the time of interpretation on 07/02/2017 at 12:35 pm to Dr. Azalia Bilis , who verbally acknowledged these results. Electronically Signed   By: Maisie Fus  Register   On: 07/02/2017 12:34    Anti-infectives: Anti-infectives (From admission, onward)   Start     Dose/Rate Route Frequency Ordered Stop   07/03/17 0800  ceFAZolin (ANCEF) IVPB 2g/100 mL premix     2 g 200 mL/hr over 30 Minutes Intravenous To [facility] 07/02/17 2154 07/04/17 0800       Assessment/Plan Fall from ladder Left sided PTX L sided rib fractures, 1sr rib and 3-7 - left sided chest tube, continous pulse ox, 2L O2 [place] - IS, pulm toilet - CXR pending Left radial frx- OR today with Haddix for ORIF - NWB LUE  FEN: NPO, IVF; K 2.9, replace and recheck BMET in AM VTE: SCDs, no lovenox with OR today ID: Ancef periop  Dispo: OR today with ortho for L wrist. CXR. PT/OT  LOS: 1 day    Wells Guiles , [facility] Surgery 07/03/2017, 8:10 AM Pager: [phone] Trauma Pager: [phone] Mon-Fri 7:00 am-4:30 pm Sat-Sun 7:00 am-11:30 am

## 2017-07-03 NOTE — Progress Notes (Signed)
Patient ID: Billy Simmons, male   DOB: Sep 27, 1970, 46 y.o.   MRN: [id number]   LOS: 1 day   Subjective: Doing well, left wrist sore but tolerable.   Objective: Vital signs in last 24 hours: Temp:  [97.7 F (36.5 C)-98.3 F (36.8 C)] 97.7 F (36.5 C) (12/05 0315) Pulse Rate:  [85-110] 88 (12/05 0800) Resp:  [13-27] 18 (12/05 0800) BP: (101-136)/(72-89) 126/81 (12/05 0800) SpO2:  [87 %-99 %] 97 % (12/05 0800) Weight:  [61.2 kg (135 lb)] 61.2 kg (135 lb) (12/04 1232) Last BM Date: 07/02/17   Laboratory  CBC Recent Labs    07/02/17 1631 07/03/17 0415  WBC 13.5* 7.8  HGB 15.6 14.2  HCT 44.9 41.8  PLT 226 209   BMET Recent Labs    07/02/17 1210 07/02/17 1631 07/03/17 0415  NA 139  --  135  K 3.2*  --  2.9*  CL 102  --  101  CO2 28  --  24  GLUCOSE 131*  --  101*  BUN 5*  --  6  CREATININE 1.02 0.86 0.74  CALCIUM 9.1  --  7.3*     Physical Exam General appearance: alert and no distress  LUE: Sugartong/sling, sensation intact, moves all fingers, warm   Assessment/Plan: Fall Left distal radius fx/ulnar styloid fx -- Tentative ORIF today with Dr. Jena GaussHaddix, may be moved later in week dependent on schedule. NWB. Please keep NPO.  Rib fxs w/ PTX s/p CT Concussion Hypokalemia -- Trauma replacing    Billy CaldronMichael J. Maciej Schweitzer, PA-C Orthopedic Surgery [phone] 07/03/2017

## 2017-07-03 NOTE — Op Note (Signed)
OrthopaedicSurgeryOperativeNote [phone](CSN:[id number]) Date of Surgery: 07/03/2017  Admit Date: 07/02/2017   Diagnoses: Pre-Op Diagnoses: Left intra-articular distal radius fracrure  Post-Op Diagnosis: Same  Procedures: CPT 25608-ORIF of intra-articular distal radius fracture  Surgeons: Primary: Roby LoftsHaddix, Chivonne Rascon P, MD   Location:[facility] OR ROOM 06   AnesthesiaGeneral   Antibiotics:Ancef 2g preop   Tourniquettime: Total Tourniquet Time Documented: Upper Arm (Left) - 59 minutes Total: Upper Arm (Left) - 59 minutes  EstimatedBloodLoss:5 mL   Complications:None  Specimens:None  Implants: Implant Name Type Inv. Item Serial No. Manufacturer Lot No. LRB No. Used Action  PLATE STANDARD DVR LEFT - SN/A Plate PLATE STANDARD DVR LEFT N/A [employer] N/A Left 1 Implanted  SCREW LOCKING 2.7X16 - SN/A Screw SCREW LOCKING 2.7X16 N/A [employer] N/A Left 2 Implanted  SCREW LOCKING 2.7X18 - SN/A Screw SCREW LOCKING 2.7X18 N/A [employer] N/A Left 3 Implanted  SCREW LOCKING 2.7X20MM - SN/A Screw SCREW LOCKING 2.7X20MM N/A [employer] N/A Left 2 Implanted  SCREW LOCKING 2.7X22MM - SN/A Screw SCREW LOCKING 2.7X22MM N/A [employer] N/A Left 2 Implanted    IndicationsforSurgery: This is a 46 year old male who had fallen approximately 15-18 feet.  He landed on an outstretched hand.  He sustained an intra-articular displaced distal radius fracture.  He also thorax with a pigtail catheter was placed.  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 Risks discussed included bleeding requiring blood transfusion, bleeding causing a hematoma, infection, malunion, nonunion, damage to surrounding nerves and blood vessels, pain, hardware prominence or irritation, hardware failure, stiffness, post-traumatic arthritis, DVT/PE, compartment syndrome, and even death.. Risks and benefits  were extensively discussed as noted above and the patient and their family agreed to proceed with surgery and consent was obtained.  Operative Findings: Intra-articular distal radius fracture treated with ORIF through volar approach and fixation with [employer] DVR plate  Procedure: The patient was identified in the preoperative holding area. Consent was confirmed with the patient and their family and all questions were answered. The operative extremity was marked after confirmation with the patient. he was then brought back to the operating room by our anesthesia colleagues. The patient was carefully transferred over to regular OR table.  They were placed under general anesthesia.  A nonsterile tourniquet was placed to the upper arm.The operative extremity was then prepped and draped in usual sterile fashion. A preoperative timeout was performed to verify the patient, the procedure, and the extremity. Preoperative antibiotics were dosed.  Fluoroscopy images were used to visualize the fracture.  An Esmarch was used to exsanguinate the extremity and then the tourniquet was inflated to 250 mmHg.  A standard FCR approach was performed.  The skin and subcutaneous tissue.  The fascia overlying the FCR was incised.  The FCR tendon was moved out of the way in the dorsal sheath was incised as well.  The finger flexors were swept out of the way and the pronator quadratus was visualized.  This was taken down from the radial border using a 15 blade and periosteal elevator.  A reduction maneuver was performed to improve the alignment of the distal articular segment.  Percutaneous K wires were placed in the radial styloid and directed proximal and ulnar to gain fixation into the intact shaft.  When the distal radius was provisionally fixed with a K wire was a volar plate was appropriately positioned in the AP and lateral fluoroscopic  imaging.  It was pinned in place was then drilled and placed in the oblong hole of  the radial shaft.  I then proceeded to place the distal fixation with locking screws.  I used fluoroscopy to confirm that they were extra-articular.  A total of 6 locking screws were placed into the distal segment.  Another 2 nonlocking screws were placed into the proximal shaft segment.  This completed the fixation construct.  Final fluoroscopic images were obtained.  The incision was thoroughly irrigated.  A gram of vancomycin powder was placed into the wound.  The quadratus was left unrepaired.  The skin was then closed with 2-0 Vicryl and 3-0 nylon suture.  A sterile dressing consisting of Adaptic, 4 x 4's, sterile cast padding volar wrist splint was placed.  Patient was then awoken from anesthesia and taken to the PACU in stable condition.  Post Op Plan/Instructions: The patient will be nonweightbearing to the left upper extremity.  He will receive postoperative Ancef.  He will see Lovenox for DVT prophylaxis.  I will see him back in 2 weeks for x-rays and suture removal.  I was present and performed the entire surgery.  Truitt MerleKevin Rainee Sweatt, MD [facility]

## 2017-07-04 ENCOUNTER — Encounter (HOSPITAL_COMMUNITY): Payer: Self-pay | Admitting: Student

## 2017-07-04 ENCOUNTER — Inpatient Hospital Stay (HOSPITAL_COMMUNITY): Payer: Self-pay

## 2017-07-04 DIAGNOSIS — S2249XA Multiple fractures of ribs, unspecified side, initial encounter for closed fracture: Secondary | ICD-10-CM

## 2017-07-04 DIAGNOSIS — W1789XA Other fall from one level to another, initial encounter: Secondary | ICD-10-CM

## 2017-07-04 DIAGNOSIS — S52572A Other intraarticular fracture of lower end of left radius, initial encounter for closed fracture: Secondary | ICD-10-CM

## 2017-07-04 LAB — CBC
HCT: 41.2 % (ref 39.0–52.0)
HEMOGLOBIN: 13.8 g/dL (ref 13.0–17.0)
MCH: 32.7 pg (ref 26.0–34.0)
MCHC: 33.5 g/dL (ref 30.0–36.0)
MCV: 97.6 fL (ref 78.0–100.0)
PLATELETS: 213 10*3/uL (ref 150–400)
RBC: 4.22 MIL/uL (ref 4.22–5.81)
RDW: 13.2 % (ref 11.5–15.5)
WBC: 6.6 10*3/uL (ref 4.0–10.5)

## 2017-07-04 LAB — BASIC METABOLIC PANEL
ANION GAP: 6 (ref 5–15)
BUN: 5 mg/dL — ABNORMAL LOW (ref 6–20)
CALCIUM: 7.6 mg/dL — AB (ref 8.9–10.3)
CO2: 23 mmol/L (ref 22–32)
Chloride: 108 mmol/L (ref 101–111)
Creatinine, Ser: 0.76 mg/dL (ref 0.61–1.24)
Glucose, Bld: 117 mg/dL — ABNORMAL HIGH (ref 65–99)
Potassium: 3.3 mmol/L — ABNORMAL LOW (ref 3.5–5.1)
SODIUM: 137 mmol/L (ref 135–145)

## 2017-07-04 MED ORDER — METHOCARBAMOL 500 MG PO TABS
500.0000 mg | ORAL_TABLET | Freq: Three times a day (TID) | ORAL | Status: DC
  Administered 2017-07-04 – 2017-07-09 (×16): 500 mg via ORAL
  Filled 2017-07-04 (×17): qty 1

## 2017-07-04 MED ORDER — POTASSIUM CHLORIDE CRYS ER 20 MEQ PO TBCR
40.0000 meq | EXTENDED_RELEASE_TABLET | Freq: Three times a day (TID) | ORAL | Status: AC
  Administered 2017-07-04 (×3): 40 meq via ORAL
  Filled 2017-07-04 (×3): qty 2

## 2017-07-04 MED ORDER — ACETAMINOPHEN 500 MG PO TABS
1000.0000 mg | ORAL_TABLET | Freq: Three times a day (TID) | ORAL | Status: DC
  Administered 2017-07-04 – 2017-07-09 (×16): 1000 mg via ORAL
  Filled 2017-07-04 (×16): qty 2

## 2017-07-04 NOTE — Progress Notes (Signed)
OT Evaluation  PTA, pt independent with mobility and ADL, lived alone, drove and was self employed. Pt presents with decreased independence with ADL and functional use of LUE. Will follow acutely to complete ADL education and education regarding edema control and management of LUE. Pt will be safe to DC home with intermittent S when medically stable.    07/04/17 1400  OT Visit Information  Last OT Received On 07/04/17  Assistance Needed +1  History of Present Illness Pt fell from ladder and fractured left wrist  - Left distal radius fx/ulnar styloid fx (ORIF 12/5), left rib fractures with PTX and left chest tube.  +LOC Amnestic to event.  Precautions  Precautions Fall  Precaution Comments chest tube  Required Braces or Orthoses Sling (for comfort only)  Restrictions  Weight Bearing Restrictions Yes  LUE Weight Bearing NWB  Home Living  Family/patient expects to be discharged to: Private residence  Living Arrangements Alone;Children;Spouse/significant other (plans to stay with girlfriend)  Available Help at Discharge Friend(s);Available 24 hours/day  Type of Home House  Home Access Stairs to enter  Entrance Stairs-Number of Steps 3  Entrance Stairs-Rails Right  Home Layout One level  Bathroom Nurse, children'shower/Tub Tub/shower unit  Bathroom Toilet Standard  Bathroom Accessibility Yes  How Accessible Accessible via walker  Home Equipment None  Additional Comments PLans to stay with girlfriend until his daughters get home from college  Prior Function  Level of Independence Independent  Comments works in Holiday representativeconstruction; 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, molecularCommunication  Communication No difficulties  Pain Assessment  Pain Assessment 0-10  Pain Score 6  Pain Location L wirst and chest tube site  Pain Descriptors / Indicators Burning;Discomfort  Pain Intervention(s) Limited activity within patient's tolerance;Repositioned  Cognition  Arousal/Alertness Awake/alert  Behavior During Therapy WFL for  tasks assessed/performed  Overall Cognitive Status No family/caregiver present to determine baseline cognitive functioning  General Comments will further assess cognition and educate on post concussive syndrome (+ LOC; does not remember event)  Upper Extremity Assessment  Upper Extremity Assessment LUE deficits/detail  LUE Deficits / Details edematous L hand but generally moving well; elbow ROM WFL; shoulder ROM limited due to chest tube  LUE Sensation (no complaints of numbenss/sensory deficits)  LUE Coordination decreased fine motor;decreased gross motor  Lower Extremity Assessment  Lower Extremity Assessment Overall WFL for tasks assessed  Cervical / Trunk Assessment  Cervical / Trunk Assessment Normal  ADL  Overall ADL's  Needs assistance/impaired  Eating/Feeding Modified independent  Grooming Minimal assistance  Upper Body Bathing Minimal assistance  Lower Body Bathing Set up;Sit to/from stand  Upper Body Dressing  Minimal assistance;Sitting  Lower Body Dressing Set up;Sit to/from Scientist, research (life sciences)stand  Toilet Transfer Supervision/safety  Functional mobility during ADLs Supervision/safety (due to lines/chest tube)  General ADL Comments Pt asking  "how am i going to take care of myself". Began education on compensatory techniques  Vision- History  Baseline Vision/History No visual deficits  Vision- Assessment  Vision Assessment? No apparent visual deficits  Bed Mobility  General bed mobility comments OOB in chair  Transfers  Overall transfer level Independent  Exercises  Exercises Other exercises  Other Exercises  Other Exercises frequent movement L digits  Other Exercises edema control with ice and elevation  Other Exercises elbow/shoulder AROM within pain tolerance  OT - End of Session  Activity Tolerance Patient tolerated treatment well  Patient left in chair;with call bell/phone within reach  Nurse Communication Mobility status;Precautions;Weight bearing status  OT Assessment  OT  Recommendation/Assessment  Patient needs continued OT Services  OT Visit Diagnosis Muscle weakness (generalized) (M62.81);Pain  Pain - Right/Left Left  Pain - part of body Arm (chest)  OT Problem List Decreased strength;Decreased range of motion;Decreased coordination;Impaired UE functional use;Pain;Increased edema  OT Plan  OT Frequency (ACUTE ONLY) Min 2X/week  OT Treatment/Interventions (ACUTE ONLY) Self-care/ADL training;Therapeutic exercise;Therapeutic activities;Patient/family education  AM-PAC OT "6 Clicks" Daily Activity Outcome Measure  Help from another person eating meals? 4  Help from another person taking care of personal grooming? 3  Help from another person toileting, which includes using toliet, bedpan, or urinal? 4  Help from another person bathing (including washing, rinsing, drying)? 3  Help from another person to put on and taking off regular upper body clothing? 3  Help from another person to put on and taking off regular lower body clothing? 4  6 Click Score 21  ADL G Code Conversion CJ  OT Recommendation  Follow Up Recommendations DC plan and follow up therapy as arranged by surgeon;Supervision - Intermittent  OT Equipment None recommended by OT  Individuals Consulted  Consulted and Agree with Results and Recommendations Patient  Acute Rehab OT Goals  Patient Stated Goal to go home  OT Goal Formulation With patient  Time For Goal Achievement 07/18/17  Potential to Achieve Goals Good  OT Time Calculation  OT Start Time (ACUTE ONLY) 1330  OT Stop Time (ACUTE ONLY) 1350  OT Time Calculation (min) 20 min  OT General Charges  $OT Visit 1 Visit  OT Evaluation  $OT Eval Moderate Complexity 1 Mod  Written Expression  Dominant Hand Left (also uses R)  Lexmark InternationalHilary Jeziel Hoffmann, OT/L  [phone] 07/04/2017

## 2017-07-04 NOTE — Evaluation (Signed)
Physical Therapy Evaluation and D/C Patient Details Name: Billy BitterJames B Funk MRN: [id number] DOB: 07-12-1971 Today's Date: 07/04/2017   History of Present Illness  Pt fell from ladder and fractured left wrist with ORIF, left rib fractures with PTX and left chest tube.    Previous smoker, drinks beer occasionally, smokes mariajuana occasionally.    Clinical Impression  Pt admitted with above diagnosis. Pt currently without significant functional limitations and is Supervision to Independent with ambulation and balance activity.  Does not need skilled PT.  Will sign off.      Follow Up Recommendations No PT follow up    Equipment Recommendations  None recommended by PT    Recommendations for Other Services       Precautions / Restrictions Precautions Precautions: Fall Precaution Comments: chest tube Required Braces or Orthoses: Sling Restrictions Weight Bearing Restrictions: Yes LUE Weight Bearing: Non weight bearing      Mobility  Bed Mobility Overal bed mobility: Independent                Transfers Overall transfer level: Independent                  Ambulation/Gait Ambulation/Gait assistance: Supervision Ambulation Distance (Feet): 250 Feet Assistive device: None Gait Pattern/deviations: WFL(Within Functional Limits)   Gait velocity interpretation: at or above normal speed for age/gender General Gait Details: No LOB with challenges.  Placed sling on for comfort.    Stairs            Wheelchair Mobility    Modified Rankin (Stroke Patients Only)       Balance                                 Standardized Balance Assessment Standardized Balance Assessment : Dynamic Gait Index   Dynamic Gait Index Level Surface: Normal Change in Gait Speed: Normal Gait with Horizontal Head Turns: Normal Gait with Vertical Head Turns: Normal Gait and Pivot Turn: Normal Step Over Obstacle: Normal Step Around Obstacles: Normal Steps:  Moderate Impairment Total Score: 22       Pertinent Vitals/Pain Pain Assessment: No/denies pain  VSS once pt pursed lip breathing with sats >90%.  Home Living Family/patient expects to be discharged to:: Private residence Living Arrangements: Alone;Children;Spouse/significant other(plans to stay with girlfriend) Available Help at Discharge: Friend(s);Available 24 hours/day Type of Home: House Home Access: Stairs to enter Entrance Stairs-Rails: Right Entrance Stairs-Number of Steps: 3 Home Layout: One level Home Equipment: None      Prior Function Level of Independence: Independent         Comments: works in Psychologist, counsellingconstruction     Hand Dominance   Dominant Hand: Left    Extremity/Trunk Assessment   Upper Extremity Assessment Upper Extremity Assessment: Defer to OT evaluation    Lower Extremity Assessment Lower Extremity Assessment: Overall WFL for tasks assessed    Cervical / Trunk Assessment Cervical / Trunk Assessment: Normal  Communication   Communication: No difficulties  Cognition Arousal/Alertness: Awake/alert Behavior During Therapy: WFL for tasks assessed/performed Overall Cognitive Status: Within Functional Limits for tasks assessed                                        General Comments      Exercises     Assessment/Plan    PT Assessment Patent does not  need any further PT services  PT Problem List         PT Treatment Interventions      PT Goals (Current goals can be found in the Care Plan section)  Acute Rehab PT Goals Patient Stated Goal: to go home PT Goal Formulation: All assessment and education complete, DC therapy    Frequency     Barriers to discharge        Co-evaluation               AM-PAC PT "6 Clicks" Daily Activity  Outcome Measure Difficulty turning over in bed (including adjusting bedclothes, sheets and blankets)?: None Difficulty moving from lying on back to sitting on the side of the bed? :  None Difficulty sitting down on and standing up from a chair with arms (e.g., wheelchair, bedside commode, etc,.)?: None Help needed moving to and from a bed to chair (including a wheelchair)?: None Help needed walking in hospital room?: None Help needed climbing 3-5 steps with a railing? : A Little 6 Click Score: 23    End of Session Equipment Utilized During Treatment: Gait belt Activity Tolerance: Patient tolerated treatment well Patient left: in chair;with call bell/phone within reach Nurse Communication: Mobility status;Weight bearing status;Precautions PT Visit Diagnosis: Other abnormalities of gait and mobility (R26.89)    Time: [id number] PT Time Calculation (min) (ACUTE ONLY): 23 min   Charges:   PT Evaluation $PT Eval Low Complexity: 1 Low PT Treatments $Gait Training: 8-22 mins   PT G Codes:        Lashaunda Schild,PT Acute Rehabilitation [phone] (pager)   Berline Lopesawn F Teresia Myint 07/04/2017, 10:56 AM

## 2017-07-04 NOTE — Progress Notes (Addendum)
Patient arrived to 4NP from PACU. Vitals stable, alert and oriented x4, pleasant and does not complain of any pain.

## 2017-07-04 NOTE — Care Management Note (Signed)
Case Management Note  Patient Details  Name: Billy Simmons MRN: [id number] Date of Birth: 04-20-1971  Subjective/Objective: Pt admitted on 07/02/17 s/p fall from ladder with fx LT wrist s/p ORIF, rib fractures, and PTX requiring LT chest tube.  PTA, pt independent, lives alone.                     Action/Plan: PT/OT recommending no OP follow up or DME.  Pt plans to dc home with girlfriend at discharge.  May need medication assistance at discharge, as he is uninsured.  Will follow.    Expected Discharge Date:                  Expected Discharge Plan:  Home/Self Care  In-House Referral:  Clinical Social Work  Discharge planning Services  CM Consult  Post Acute Care Choice:    Choice offered to:     DME Arranged:    DME Agency:     HH Arranged:    HH Agency:     Status of Service:  In process, will continue to follow  If discussed at Long Length of Stay Meetings, dates discussed:    Additional Comments:  [name], RN, BSN  Trauma/Neuro ICU Case Manager [phone]

## 2017-07-04 NOTE — Progress Notes (Signed)
[facility] Progress Note  1 Day Post-Op  Subjective: CC: pain in left wrist Patient states pain in left wrist is controlled with medication, but that is where he feels pain the most. Feels pain in left ribs only with a lot of movement or deep inspiration. Denies SOB, chest pressure, palpitations. Tolerating diet, no abdominal pain, no n/v. UOP good. VSS.   Objective: Vital signs in last 24 hours: Temp:  [97.6 F (36.4 C)-98.8 F (37.1 C)] 98.8 F (37.1 C) (12/06 0318) Pulse Rate:  [84-115] 100 (12/06 0318) Resp:  [12-25] 25 (12/06 0318) BP: (109-142)/(74-87) 113/82 (12/06 0318) SpO2:  [84 %-97 %] 96 % (12/06 0318) Weight:  [61.2 kg (135 lb)] 61.2 kg (135 lb) (12/05 1607) Last BM Date: 07/02/17  Intake/Output from previous day: 12/05 0701 - 12/06 0700 In: 4370.8 [P.O.:480; I.V.:3480.8; IV Piggyback:410] Out: 3913 [Urine:3200; Blood:705; Chest Tube:8] Intake/Output this shift: No intake/output data recorded.  PE: Gen:  Alert, NAD, pleasant Card:  Regular rate and rhythm, pedal pulses 2+ BL Pulm:  Normal effort, clear to auscultation bilaterally, left chest tube without air leak and <20 cc drainage Abd: Soft, non-tender, non-distended, bowel sounds present  Ext: LUE splinted, fingers WWP, sensation/motor intact; ROM grossly intact in RUE and BL LE Skin: warm and dry, no rashes  Psych: A&Ox3   Lab Results:  Recent Labs    07/03/17 0415 07/04/17 0407  WBC 7.8 6.6  HGB 14.2 13.8  HCT 41.8 41.2  PLT 209 213   BMET Recent Labs    07/03/17 0415 07/04/17 0407  NA 135 137  K 2.9* 3.3*  CL 101 108  CO2 24 23  GLUCOSE 101* 117*  BUN 6 5*  CREATININE 0.74 0.76  CALCIUM 7.3* 7.6*   PT/INR No results for input(s): LABPROT, INR in the last 72 hours. CMP     Component Value Date/Time   NA 137 07/04/2017 0407   K 3.3 (L) 07/04/2017 0407   CL 108 07/04/2017 0407   CO2 23 07/04/2017 0407   GLUCOSE 117 (H) 07/04/2017 0407   BUN 5 (L) 07/04/2017 0407    CREATININE 0.76 07/04/2017 0407   CALCIUM 7.6 (L) 07/04/2017 0407   PROT 5.3 (L) 07/03/2017 0415   ALBUMIN 3.2 (L) 07/03/2017 0415   AST 30 07/03/2017 0415   ALT 10 (L) 07/03/2017 0415   ALKPHOS 40 07/03/2017 0415   BILITOT 0.9 07/03/2017 0415   GFRNONAA >60 07/04/2017 0407   GFRAA >60 07/04/2017 0407   Lipase  No results found for: LIPASE     Studies/Results: Dg Wrist Complete Left  Result Date: 07/03/2017 CLINICAL DATA:  Fracture fixation. EXAM: LEFT WRIST - COMPLETE 3+ VIEW COMPARISON:  07/02/2017 FINDINGS: Post open reduction internal fixation of distal radial fracture , post volar plate and screw fixation. The alignment is near anatomic. No evidence of immediate complications. Associated ulnar styloid process avulsion fracture, without significant displacement. IMPRESSION: Post plate and screw fixation of comminuted distal radial fracture with near anatomic alignment and no evidence of immediate complications. Electronically Signed   By: Ted Mcalpineobrinka  Dimitrova M.D.   On: 07/03/2017 23:28   Dg Wrist Complete Left  Result Date: 07/03/2017 CLINICAL DATA:  ORIF of left wrist EXAM: DG C-ARM 61-120 MIN; LEFT WRIST - COMPLETE 3+ VIEW COMPARISON:  None. FINDINGS: 1 minutes 45 seconds of fluoroscopic time for placement of a volar DVR plate fixing a comminuted distal radius fracture is noted. Nondisplaced ulnar styloid fracture is also seen. Fine bony detail is  limited. Alignment appears near-anatomic post plating. IMPRESSION: Volar plate and screw fixation of distal comminuted left radius fracture in near anatomic alignment. Nondisplaced ulnar styloid fracture. Electronically Signed   By: Tollie Ethavid  Kwon M.D.   On: 07/03/2017 21:06   Dg Wrist Complete Left  Result Date: 07/02/2017 CLINICAL DATA:  78Forty-six or male with left wrist pain after fall from roof. EXAM: LEFT WRIST - COMPLETE 3+ VIEW COMPARISON:  None. FINDINGS: There is an acute, comminuted, closed and intra-articular fracture involving the  distal left radius extending into the radiocarpal and distal radioulnar joints. There is dorsal angulation of the distal fracture fragments with dorsal displacement of the carpal rows. Approximately 2 mm of displacement is noted between the fracture fragments that involve the articulating surface of the radius as seen on the lateral view. A fracture of the base of the ulnar styloid is noted. No carpal bone fracture is visualized. IMPRESSION: 1. Acute, dorsally angulated and displaced distal radius fracture extending into the radiocarpal and distal radioulnar joints. 2. Nondisplaced ulnar styloid fracture. 3. Intact carpal bones. Electronically Signed   By: Tollie Ethavid  Kwon M.D.   On: 07/02/2017 12:40   Ct Head Wo Contrast  Result Date: 07/02/2017 CLINICAL DATA:  Fall from roof EXAM: CT HEAD WITHOUT CONTRAST CT CERVICAL SPINE WITHOUT CONTRAST TECHNIQUE: Multidetector CT imaging of the head and cervical spine was performed following the standard protocol without intravenous contrast. Multiplanar CT image reconstructions of the cervical spine were also generated. COMPARISON:  None. FINDINGS: CT HEAD FINDINGS Brain: No evidence of acute infarction, hemorrhage, hydrocephalus, extra-axial collection or mass lesion/mass effect. Vascular: Negative Skull: Negative Sinuses/Orbits: Mild mucosal edema maxillary sinuses bilaterally. Normal orbit. Other: None CT CERVICAL SPINE FINDINGS Alignment: Normal Skull base and vertebrae: Negative for cervical spine fracture Soft tissues and spinal canal: Negative Disc levels: Mild disc degeneration. Small central disc protrusion C5-6 and C6-7 Upper chest: Mildly displaced fracture left first rib. Mild soft tissue contusion above the left lung apex likely due to mild bleeding. Left pneumothorax. Apical blebs bilaterally. Other: None IMPRESSION: 1. Negative CT head 2. Negative for cervical spine fracture 3. Fracture left first rib with surrounding contusion. Left pneumothorax. Electronically  Signed   By: Marlan Palauharles  Clark M.D.   On: 07/02/2017 14:48   Ct Chest W Contrast  Result Date: 07/02/2017 CLINICAL DATA:  Fall from roof.  Trauma EXAM: CT CHEST, ABDOMEN, AND PELVIS WITH CONTRAST TECHNIQUE: Multidetector CT imaging of the chest, abdomen and pelvis was performed following the standard protocol during bolus administration of intravenous contrast. CONTRAST:  100mL ISOVUE-300 IOPAMIDOL (ISOVUE-300) INJECTION 61% COMPARISON:  None. FINDINGS: CT CHEST FINDINGS Cardiovascular: Heart size within normal limits. Thoracic aorta normal. Pulmonary artery is normal. Mediastinum/Nodes: Negative for pneumomediastinum or hemorrhage. No mass. Lungs/Pleura: Large left pneumothorax with collapse of most of the left lower lobe. Upper lobe emphysema bilaterally. Apical blebs bilaterally. Mild right lower lobe atelectasis. Negative for pleural effusion Musculoskeletal: Multiple left rib fractures. Left first rib fracture. Nondisplaced ribs left 3 through 7 ribs posteriorly and laterally. No displaced rib fractures. No thoracic vertebral fracture. CT ABDOMEN PELVIS FINDINGS Hepatobiliary: Normal liver and gallbladder. Pancreas: Negative Spleen: Negative Adrenals/Urinary Tract: Negative Stomach/Bowel: No bowel obstruction or edema. Vascular/Lymphatic: Mild atherosclerotic disease in the aorta. Reproductive: Prostate and seminal vesicle enlargement bilaterally. Other: No free-fluid Musculoskeletal: Negative for lumbar or pelvic fracture. IMPRESSION: Multiple nondisplaced left rib fractures including the left first rib. Large left pneumothorax. Bibasilar atelectasis left greater than right. Apical emphysema and blebs bilaterally. No acute  injury in the abdomen. Electronically Signed   By: Marlan Palau M.D.   On: 07/02/2017 14:55   Ct Cervical Spine Wo Contrast  Result Date: 07/02/2017 CLINICAL DATA:  Fall from roof EXAM: CT HEAD WITHOUT CONTRAST CT CERVICAL SPINE WITHOUT CONTRAST TECHNIQUE: Multidetector CT imaging of  the head and cervical spine was performed following the standard protocol without intravenous contrast. Multiplanar CT image reconstructions of the cervical spine were also generated. COMPARISON:  None. FINDINGS: CT HEAD FINDINGS Brain: No evidence of acute infarction, hemorrhage, hydrocephalus, extra-axial collection or mass lesion/mass effect. Vascular: Negative Skull: Negative Sinuses/Orbits: Mild mucosal edema maxillary sinuses bilaterally. Normal orbit. Other: None CT CERVICAL SPINE FINDINGS Alignment: Normal Skull base and vertebrae: Negative for cervical spine fracture Soft tissues and spinal canal: Negative Disc levels: Mild disc degeneration. Small central disc protrusion C5-6 and C6-7 Upper chest: Mildly displaced fracture left first rib. Mild soft tissue contusion above the left lung apex likely due to mild bleeding. Left pneumothorax. Apical blebs bilaterally. Other: None IMPRESSION: 1. Negative CT head 2. Negative for cervical spine fracture 3. Fracture left first rib with surrounding contusion. Left pneumothorax. Electronically Signed   By: Marlan Palau M.D.   On: 07/02/2017 14:48   Ct Abdomen Pelvis W Contrast  Result Date: 07/02/2017 CLINICAL DATA:  Fall from roof.  Trauma EXAM: CT CHEST, ABDOMEN, AND PELVIS WITH CONTRAST TECHNIQUE: Multidetector CT imaging of the chest, abdomen and pelvis was performed following the standard protocol during bolus administration of intravenous contrast. CONTRAST:  ISOVUE-300 IOPAMIDOL (ISOVUE-300) INJECTION 61% COMPARISON:  None. FINDINGS: CT CHEST FINDINGS Cardiovascular: Heart size within normal limits. Thoracic aorta normal. Pulmonary artery is normal. Mediastinum/Nodes: Negative for pneumomediastinum or hemorrhage. No mass. Lungs/Pleura: Large left pneumothorax with collapse of most of the left lower lobe. Upper lobe emphysema bilaterally. Apical blebs bilaterally. Mild right lower lobe atelectasis. Negative for pleural effusion Musculoskeletal:  Multiple left rib fractures. Left first rib fracture. Nondisplaced ribs left 3 through 7 ribs posteriorly and laterally. No displaced rib fractures. No thoracic vertebral fracture. CT ABDOMEN PELVIS FINDINGS Hepatobiliary: Normal liver and gallbladder. Pancreas: Negative Spleen: Negative Adrenals/Urinary Tract: Negative Stomach/Bowel: No bowel obstruction or edema. Vascular/Lymphatic: Mild atherosclerotic disease in the aorta. Reproductive: Prostate and seminal vesicle enlargement bilaterally. Other: No free-fluid Musculoskeletal: Negative for lumbar or pelvic fracture. IMPRESSION: Multiple nondisplaced left rib fractures including the left first rib. Large left pneumothorax. Bibasilar atelectasis left greater than right. Apical emphysema and blebs bilaterally. No acute injury in the abdomen. Electronically Signed   By: Marlan Palau M.D.   On: 07/02/2017 14:55   Ct Wrist Left Wo Contrast  Result Date: 07/02/2017 CLINICAL DATA:  Wrist pain after fall from roof today. Known fracture on radiographs. Splinted. EXAM: CT OF THE LEFT WRIST WITHOUT CONTRAST TECHNIQUE: Multidetector CT imaging was performed according to the standard protocol. Multiplanar CT image reconstructions were also generated. COMPARISON:  Radiographs from earlier on the same day. FINDINGS: Bones/Joint/Cartilage Fine bony detail is limited by overlying splint. Acute, closed, intra-articular comminuted fracture of the distal radius extending into the radiocarpal joint at the level of the lunate hand with extension into the distal radioulnar joint is again demonstrated. Interval decrease in dorsal angulation of the distal fracture fragment, now estimated at 30 degrees of dorsal angulation versus at least 64 degrees previously on the radiographs. Nondisplaced ulnar styloid fracture. Carpal rows are maintained. Ligaments Suboptimally assessed by CT. Muscles and Tendons No atrophy or intramuscular hemorrhage. Soft tissues Negative IMPRESSION: 1.  Decrease in  dorsal angulation of a comminuted intra-articular fracture of the distal radius extending into the radiocarpal and distal radioulnar joint. The degree of dorsal angulation is estimated at 30 degrees currently versus 64 degrees previously. 2. Nondisplaced ulnar styloid fracture. Electronically Signed   By: Tollie Eth M.D.   On: 07/02/2017 18:53   Dg Chest Port 1 View  Result Date: 07/03/2017 CLINICAL DATA:  Follow-up pneumothorax EXAM: PORTABLE CHEST 1 VIEW COMPARISON:  07/02/2017 FINDINGS: Left-sided chest tube is again identified. No sizable pneumothorax is seen. No focal infiltrate is noted. Mild left basilar atelectasis is seen. Cardiac shadow is stable. The known rib fractures are less well appreciated on the current exam. Only the posterior fourth and fifth rib fractures on the left are noted. IMPRESSION: No pneumothorax following chest tube placement. Mild left basilar atelectasis and left rib fractures. Electronically Signed   By: Alcide Clever M.D.   On: 07/03/2017 09:53   Dg Chest Port 1 View  Result Date: 07/02/2017 CLINICAL DATA:  Pneumothorax. EXAM: PORTABLE CHEST 1 VIEW COMPARISON:  CT chest and chest x-ray from same day. FINDINGS: Interval placement of a left-sided chest tube. No significant residual left-sided pneumothorax. The cardiomediastinal silhouette is normal in size. Bibasilar atelectasis. No consolidation or pleural effusion. Unchanged left-sided rib fractures. IMPRESSION: Interval placement of a left-sided chest tube. No significant residual left-sided pneumothorax. Electronically Signed   By: Obie Dredge M.D.   On: 07/02/2017 15:25   Dg Chest Portable 1 View  Result Date: 07/02/2017 CLINICAL DATA:  Fall. Left-sided injury. Pain. Shortness of breath. EXAM: PORTABLE CHEST 1 VIEW COMPARISON:  None. FINDINGS: Mediastinum hilar structures normal. Bibasilar atelectasis. Small left-sided pneumothorax noted. No pleural effusion. Nondisplaced left 6th rib fracture cannot  be excluded . IMPRESSION: Small left-sided pneumothorax. Nondisplaced left 6th rib fracture cannot be excluded. Critical Value/emergent results were called by telephone at the time of interpretation on 07/02/2017 at 12:35 pm to Dr. Azalia Bilis , who verbally acknowledged these results. Electronically Signed   By: Maisie Fus  Register   On: 07/02/2017 12:34   Dg C-arm 1-60 Min  Result Date: 07/03/2017 CLINICAL DATA:  ORIF of left wrist EXAM: DG C-ARM 61-120 MIN; LEFT WRIST - COMPLETE 3+ VIEW COMPARISON:  None. FINDINGS: 1 minutes 45 seconds of fluoroscopic time for placement of a volar DVR plate fixing a comminuted distal radius fracture is noted. Nondisplaced ulnar styloid fracture is also seen. Fine bony detail is limited. Alignment appears near-anatomic post plating. IMPRESSION: Volar plate and screw fixation of distal comminuted left radius fracture in near anatomic alignment. Nondisplaced ulnar styloid fracture. Electronically Signed   By: Tollie Eth M.D.   On: 07/03/2017 21:06    Anti-infectives: Anti-infectives (From admission, onward)   Start     Dose/Rate Route Frequency Ordered Stop   07/03/17 1902  vancomycin (VANCOCIN) powder  Status:  Discontinued       As needed 07/03/17 1904 07/03/17 1923   07/03/17 0800  ceFAZolin (ANCEF) IVPB 2g/100 mL premix     2 g 200 mL/hr over 30 Minutes Intravenous To [facility] 07/02/17 2154 07/03/17 1754       Assessment/Plan Fall from ladder Left sided PTX L sided rib fractures, 1sr rib and 3-7 -left sided chest tube, continous pulse ox - IS, pulm toilet - CXR without PTX - put L CT to water seal Left radial frx- s/p ORIF 12/5 Dr. Jena Gauss - NWB LUE  FEN: regular diet, saline lock IV; K 3.3, replace and recheck BMET in AM VTE:  SCDs, lovenox ID: Ancef periop  Dispo: PT/OT. Encourage IS. Mobilize. CT to water seal. May be ready for discharge in the next 24-48 hrs    LOS: 2 days    Wells Guiles , [facility]  Surgery 07/04/2017, 7:28 AM Pager: [phone] Trauma Pager: [phone] Mon-Fri 7:00 am-4:30 pm Sat-Sun 7:00 am-11:30 am

## 2017-07-04 NOTE — Progress Notes (Signed)
Patient ID: Billy Simmons, male   DOB: January 20, 1971, 46 y.o.   MRN: [id number]   LOS: 2 days   Subjective: Wrist is sore but tolerable.   Objective: Vital signs in last 24 hours: Temp:  [97.6 F (36.4 C)-98.8 F (37.1 C)] 98.3 F (36.8 C) (12/06 0809) Pulse Rate:  [84-115] 84 (12/06 0809) Resp:  [12-25] 24 (12/06 0809) BP: (104-142)/(70-87) 104/70 (12/06 0809) SpO2:  [84 %-96 %] 90 % (12/06 0809) Weight:  [61.2 kg (135 lb)] 61.2 kg (135 lb) (12/05 1607) Last BM Date: 07/02/17   Laboratory  CBC Recent Labs    07/03/17 0415 07/04/17 0407  WBC 7.8 6.6  HGB 14.2 13.8  HCT 41.8 41.2  PLT 209 213   BMET Recent Labs    07/03/17 0415 07/04/17 0407  NA 135 137  K 2.9* 3.3*  CL 101 108  CO2 24 23  GLUCOSE 101* 117*  BUN 6 5*  CREATININE 0.74 0.76  CALCIUM 7.3* 7.6*     Physical Exam General appearance: alert and no distress  LUE: Ace wrap in place, fingers warm, cap refill <2s, motor/sensation intact   Assessment/Plan: Left wrist fx s/p ORIF -- NWB, should f/u with Dr. Jena GaussHaddix in 2 weeks.    Freeman CaldronMichael J. Narek Kniss, PA-C Orthopedic Surgery [phone] 07/04/2017

## 2017-07-05 ENCOUNTER — Inpatient Hospital Stay (HOSPITAL_COMMUNITY): Payer: Self-pay

## 2017-07-05 LAB — BASIC METABOLIC PANEL
ANION GAP: 8 (ref 5–15)
BUN: 6 mg/dL (ref 6–20)
CALCIUM: 8.9 mg/dL (ref 8.9–10.3)
CO2: 26 mmol/L (ref 22–32)
Chloride: 103 mmol/L (ref 101–111)
Creatinine, Ser: 0.93 mg/dL (ref 0.61–1.24)
Glucose, Bld: 104 mg/dL — ABNORMAL HIGH (ref 65–99)
Potassium: 4.3 mmol/L (ref 3.5–5.1)
Sodium: 137 mmol/L (ref 135–145)

## 2017-07-05 LAB — GLUCOSE, CAPILLARY: Glucose-Capillary: 110 mg/dL — ABNORMAL HIGH (ref 65–99)

## 2017-07-05 NOTE — Clinical Social Work Note (Signed)
Clinical Social Worker met with patient at bedside to offer support and discuss patient needs at discharge.  Patient states that he was helping a neighbor blow leaves off their roof with a backpack blower when he fell.  Patient currently lives at home with his two dogs and his two daughters intermittently live with him but are currently in college.  Patient does have a girlfriend who has agreed for patient to come and stay with her during his rehab process for additional support.  Patient family plans to provide transportation and has 4 wheel drive ability if the weather gets bad.  Patient is anxious for return home once medically stable.  CSW inquired about current substance use.  Patient states that he drinks socially with friends about once a month or less and smokes a little marijuana.  Patient is not concerned regarding current use and does not feel that resources are necessary at this time.  SBIRT complete.  Clinical Social Worker will sign off for now as social work intervention is no longer needed. Please consult [place] again if new need arises.  Barbette Or, [place]

## 2017-07-05 NOTE — Progress Notes (Signed)
Patient ID: Billy Simmons, male   DOB: 1970-11-17, 46 y.o.   MRN: [id number]   LOS: 3 days   Subjective: Wrist pain well controlled, no N/T.   Objective: Vital signs in last 24 hours: Temp:  [97.7 F (36.5 C)-98.7 F (37.1 C)] 98.6 F (37 C) (12/07 0814) Pulse Rate:  [81-97] 83 (12/06 1600) Resp:  [16-22] 17 (12/06 1600) BP: (110-128)/(81-88) 110/85 (12/06 1600) SpO2:  [91 %-96 %] 91 % (12/06 1600) Last BM Date: 07/02/17   Laboratory  CBC Recent Labs    07/03/17 0415 07/04/17 0407  WBC 7.8 6.6  HGB 14.2 13.8  HCT 41.8 41.2  PLT 209 213   BMET Recent Labs    07/04/17 0407 07/05/17 0424  NA 137 137  K 3.3* 4.3  CL 108 103  CO2 23 26  GLUCOSE 117* 104*  BUN 5* 6  CREATININE 0.76 0.93  CALCIUM 7.6* 8.9     Physical Exam General appearance: alert and no distress  LUE: Fingers warm, motion/sensation intact, dressing in place   Assessment/Plan: Left wrist fx s/p ORIF -- NWB, should f/u with Dr. Jena GaussHaddix in 2 weeks.    Freeman CaldronMichael J. Mackenzy Grumbine, PA-C Orthopedic Surgery [phone] 07/05/2017

## 2017-07-05 NOTE — Progress Notes (Signed)
[facility] Progress Note  2 Days Post-Op  Subjective: CC: no complaints Patient states pain is well controlled. Tolerating diet, denies abdominal pain, n/v. Denies SOB, chest pressure. Using IS. UOP good. VSS.   Objective: Vital signs in last 24 hours: Temp:  [97.7 F (36.5 C)-98.7 F (37.1 C)] 98.6 F (37 C) (12/07 0814) Pulse Rate:  [81-97] 83 (12/06 1600) Resp:  [16-22] 17 (12/06 1600) BP: (110-128)/(81-88) 110/85 (12/06 1600) SpO2:  [91 %-96 %] 91 % (12/06 1600) Last BM Date: 07/02/17  Intake/Output from previous day: 12/06 0701 - 12/07 0700 In: 0  Out: 502 [Urine:500; Chest Tube:2] Intake/Output this shift: No intake/output data recorded.  PE: Gen:  Alert, NAD, pleasant Card:  Regular rate and rhythm, pedal pulses 2+ BL Pulm:  Normal effort, clear to auscultation bilaterally, left chest tube with intermittent air leak and 40 cc serosanguinous drainage Abd: Soft, non-tender, non-distended, bowel sounds present  Ext: LUE splinted, fingers WWP, sensation/motor intact; ROM grossly intact in RUE and BL LE Skin: warm and dry, no rashes  Psych: A&Ox3    Lab Results:  Recent Labs    07/03/17 0415 07/04/17 0407  WBC 7.8 6.6  HGB 14.2 13.8  HCT 41.8 41.2  PLT 209 213   BMET Recent Labs    07/04/17 0407 07/05/17 0424  NA 137 137  K 3.3* 4.3  CL 108 103  CO2 23 26  GLUCOSE 117* 104*  BUN 5* 6  CREATININE 0.76 0.93  CALCIUM 7.6* 8.9   PT/INR No results for input(s): LABPROT, INR in the last 72 hours. CMP     Component Value Date/Time   NA 137 07/05/2017 0424   K 4.3 07/05/2017 0424   CL 103 07/05/2017 0424   CO2 26 07/05/2017 0424   GLUCOSE 104 (H) 07/05/2017 0424   BUN 6 07/05/2017 0424   CREATININE 0.93 07/05/2017 0424   CALCIUM 8.9 07/05/2017 0424   PROT 5.3 (L) 07/03/2017 0415   ALBUMIN 3.2 (L) 07/03/2017 0415   AST 30 07/03/2017 0415   ALT 10 (L) 07/03/2017 0415   ALKPHOS 40 07/03/2017 0415   BILITOT 0.9 07/03/2017 0415    GFRNONAA >60 07/05/2017 0424   GFRAA >60 07/05/2017 0424   Lipase  No results found for: LIPASE     Studies/Results: Dg Wrist Complete Left  Result Date: 07/03/2017 CLINICAL DATA:  Fracture fixation. EXAM: LEFT WRIST - COMPLETE 3+ VIEW COMPARISON:  07/02/2017 FINDINGS: Post open reduction internal fixation of distal radial fracture , post volar plate and screw fixation. The alignment is near anatomic. No evidence of immediate complications. Associated ulnar styloid process avulsion fracture, without significant displacement. IMPRESSION: Post plate and screw fixation of comminuted distal radial fracture with near anatomic alignment and no evidence of immediate complications. Electronically Signed   By: Ted Mcalpineobrinka  Dimitrova M.D.   On: 07/03/2017 23:28   Dg Wrist Complete Left  Result Date: 07/03/2017 CLINICAL DATA:  ORIF of left wrist EXAM: DG C-ARM 61-120 MIN; LEFT WRIST - COMPLETE 3+ VIEW COMPARISON:  None. FINDINGS: 1 minutes 45 seconds of fluoroscopic time for placement of a volar DVR plate fixing a comminuted distal radius fracture is noted. Nondisplaced ulnar styloid fracture is also seen. Fine bony detail is limited. Alignment appears near-anatomic post plating. IMPRESSION: Volar plate and screw fixation of distal comminuted left radius fracture in near anatomic alignment. Nondisplaced ulnar styloid fracture. Electronically Signed   By: Tollie Ethavid  Kwon M.D.   On: 07/03/2017 21:06   [facility]  Result Date: 07/05/2017 CLINICAL DATA:  Pneumothorax with left chest tube.  Follow-up. EXAM: PORTABLE CHEST 1 VIEW COMPARISON:  07/04/2017 FINDINGS: Slightly increased lucency at the left apex without of visible pleural wind. This could be due to a small amount of pleural air or due to better aeration at the left apex. Certainly there is no large pneumothorax. Mild bibasilar atelectasis persists. Multiple left-sided rib fractures as noted previously IMPRESSION: Increased lucency at the left apex  without detection of a pleural line. This could be due to en face pleural air or better aeration at the left apex. No definite or large pneumothorax. Electronically Signed   By: Paulina FusiMark  Shogry M.D.   On: 07/05/2017 08:20   Dg Chest Port 1 View  Result Date: 07/04/2017 CLINICAL DATA:  Left chest tube.  History of pneumothorax. EXAM: PORTABLE CHEST 1 VIEW COMPARISON:  07/03/2017. FINDINGS: Left chest tube in stable position. No pneumothorax. Heart size stable. Bibasilar atelectasis. Small left pleural effusion. Left rib fractures best identified on prior recent CT. Left chest wall subcutaneous emphysema progressed from prior exam. IMPRESSION: 1. Left chest tube in stable position. No pneumothorax. Left chest wall subcutaneous emphysema progressed from prior exam. 2.  Bibasilar atelectasis and small left pleural effusion . Electronically Signed   By: Maisie Fushomas  Register   On: 07/04/2017 08:17   Dg C-arm 1-60 Min  Result Date: 07/03/2017 CLINICAL DATA:  ORIF of left wrist EXAM: DG C-ARM 61-120 MIN; LEFT WRIST - COMPLETE 3+ VIEW COMPARISON:  None. FINDINGS: 1 minutes 45 seconds of fluoroscopic time for placement of a volar DVR plate fixing a comminuted distal radius fracture is noted. Nondisplaced ulnar styloid fracture is also seen. Fine bony detail is limited. Alignment appears near-anatomic post plating. IMPRESSION: Volar plate and screw fixation of distal comminuted left radius fracture in near anatomic alignment. Nondisplaced ulnar styloid fracture. Electronically Signed   By: Tollie Ethavid  Kwon M.D.   On: 07/03/2017 21:06    Anti-infectives: Anti-infectives (From admission, onward)   Start     Dose/Rate Route Frequency Ordered Stop   07/03/17 1902  vancomycin (VANCOCIN) powder  Status:  Discontinued       As needed 07/03/17 1904 07/03/17 1923   07/03/17 0800  ceFAZolin (ANCEF) IVPB 2g/100 mL premix     2 g 200 mL/hr over 30 Minutes [facility] 07/02/17 2154 07/03/17 1754        Assessment/Plan Fall from ladder Left sided PTX L sided rib fractures, 1sr rib and 3-7 -left sided chest tube, continous pulse ox - IS, pulm toilet - CXR without PTX - put L CT to water seal again today Left radial frx- s/p ORIF 12/5 Dr. Jena GaussHaddix - NWB LUE  FEN: regular diet, saline lock IV; K 4.3 VTE: SCDs, lovenox ID: Ancef periop Follow up: trauma clinic, [facility]  Dispo: Encourage IS. Mobilize. CT on suction with intermittent air leak.     LOS: 3 days    Wells GuilesKelly Rayburn , [facility] 07/05/2017, 8:52 AM Pager: [phone] Trauma Pager: [phone] Mon-Fri 7:00 am-4:30 pm Sat-Sun 7:00 am-11:30 am

## 2017-07-05 NOTE — Progress Notes (Signed)
Occupational Therapy Treatment Patient Details Name: Billy Simmons: [id number] DOB: Apr 29, 1971 Today's Date: 07/05/2017    History of present illness Pt fell from ladder and fractured left wrist  - Left distal radius fx/ulnar styloid fx (ORIF 12/5), left rib fractures with PTX and left chest tube.  +LOC Amnestic to event.   OT comments  On entry to room, pt holding L hand in dependent position. L hand edematous and affecting ROM. Pt educated again on edema control techniques and ROM. Order received to fabricate removable thermoplastic wrist cock up splint. Postop splint removed and wrist cock up splint fabricated. Pt to wear splint at all times with the exception of dressing changes. If splint causes discomfort, please notify OT at [phone]. Splint should be removed daily and wiped clean with a cool cloth and completely dried before reapplying. Do not place the splint under hot water. Pt given additional straps and stockinette to use. Pt seen again this pm for a splint check and is tolerating without problems. Pt states the splint feels "great". Will continue to follow acutely to facilitate a safe DC home. Will need to also educate family on care of splint/management of LUE.     Follow Up Recommendations  DC plan and follow up therapy as arranged by surgeon;Supervision - Intermittent    Equipment Recommendations  None recommended by OT    Recommendations for Other Services      Precautions / Restrictions Precautions Precautions: Fall Precaution Comments: chest tube Required Braces or Orthoses: Other Brace/Splint Other Brace/Splint: has  Restrictions Weight Bearing Restrictions: Yes LUE Weight Bearing: Non weight bearing Other Position/Activity Restrictions: Pt has sling but no orders for sling       Mobility Bed Mobility               General bed mobility comments: OOB in chair  Transfers                      Balance                                            ADL either performed or assessed with clinical judgement   ADL Overall ADL's : Needs assistance/impaired                                             Vision       Perception     Praxis      Cognition Arousal/Alertness: Awake/alert Behavior During Therapy: WFL for tasks assessed/performed Overall Cognitive Status: No family/caregiver present to determine baseline cognitive functioning                                          Exercises Other Exercises Other Exercises: A/AAROM L digits - full composite flex/extension x 10 Other Exercises: L elbow/shoulder AROM as tolerated   Shoulder Instructions       General Comments Postop splint and dressing removed and dressing changed using petroleum gauze covered with 4x4 and kerlex. Wrist cock up splint fabricated. Purpse adn care of splint explained to pt. Pt continued to ask if he could take a shower while wearing the splint.  Explained that the splint could not be placed in water and that he needed to get clarification from the MD regarding showers. Pt continued to ask about taking shower is he  "only gotit a little bit wet" - will address further. Splint checked on later visit and pt tolerating without complaints/complications. No redness observed.     Pertinent Vitals/ Pain       Pain Assessment: 0-10 Pain Score: 5  Pain Location: (chest tube site) Pain Descriptors / Indicators: Burning;Discomfort Pain Intervention(s): Limited activity within patient's tolerance(RN notified)  Home Living                                          Prior Functioning/Environment              Frequency  Min 2X/week        Progress Toward Goals  OT Goals(current goals can now be found in the care plan section)  Progress towards OT goals: Progressing toward goals  Acute Rehab OT Goals Patient Stated Goal: to go home OT Goal Formulation: With patient Time For  Goal Achievement: 07/18/17 Potential to Achieve Goals: Good ADL Goals Pt Will Perform Upper Body Bathing: with modified independence Pt Will Perform Upper Body Dressing: with modified independence Pt/caregiver will Perform Home Exercise Program: Left upper extremity Additional ADL Goal #1: Pt will independently verbalize 3 strategies for edema control LUE  Plan Discharge plan remains appropriate    Co-evaluation                 AM-PAC PT "6 Clicks" Daily Activity     Outcome Measure   Help from another person eating meals?: None Help from another person taking care of personal grooming?: A Little Help from another person toileting, which includes using toliet, bedpan, or urinal?: None Help from another person bathing (including washing, rinsing, drying)?: A Little Help from another person to put on and taking off regular upper body clothing?: A Little Help from another person to put on and taking off regular lower body clothing?: None 6 Click Score: 21    End of Session    OT Visit Diagnosis: Muscle weakness (generalized) (M62.81);Pain Pain - part of body: (chest)   Activity Tolerance Patient tolerated treatment well   Patient Left in chair;with call bell/phone within reach;with family/visitor present   Nurse Communication Weight bearing status;Other (comment)(splint care)        Time: [id number] OT Time Calculation (min): 15 min  2nd visit 1425 - 1545 Time: 70 min 3rd visit: [id number] Time: 10 min  Charges: OT General Charges $OT Visit: (3 visits) OT Treatments $Therapeutic Activity: 23-37 mins $Therapeutic Exercise: 8-22 mins $Orthotics Fit/Training: 38-52 mins $Orthotics/Prosthetics Check: 8-22 mins $ Splint materials basic: 1 Supply  [facility] Billy Simmons, OT/L  [phone] 07/05/2017   Billy Simmons,Billy Simmons 07/05/2017, 5:49 PM

## 2017-07-06 ENCOUNTER — Inpatient Hospital Stay (HOSPITAL_COMMUNITY): Payer: Self-pay

## 2017-07-06 MED ORDER — DOCUSATE SODIUM 100 MG PO CAPS
100.0000 mg | ORAL_CAPSULE | Freq: Two times a day (BID) | ORAL | Status: DC
  Administered 2017-07-06 – 2017-07-08 (×6): 100 mg via ORAL
  Filled 2017-07-06 (×6): qty 1

## 2017-07-06 MED ORDER — POLYETHYLENE GLYCOL 3350 17 G PO PACK
17.0000 g | PACK | Freq: Every day | ORAL | Status: DC | PRN
  Administered 2017-07-07 – 2017-07-08 (×2): 17 g via ORAL
  Filled 2017-07-06 (×3): qty 1

## 2017-07-06 NOTE — Progress Notes (Signed)
Occupational Therapy Treatment Patient Details Name: Billy BitterJames B Enzor MRN: [id number] DOB: May 02, 1971 Today's Date: 07/06/2017    History of present illness Pt fell from ladder and fractured left wrist  - Left distal radius fx/ulnar styloid fx (ORIF 12/5), left rib fractures with PTX and left chest tube.  +LOC Amnestic to event.   OT comments  Pt seen for splint check and education. Pt reports L hand splint "feels great" with no signs of redness or pressure. Educated pt on splint check at home, splint wear schedule, and care of splint. Educated pt on digit ROM, elevation, and ice for edema control--pt able to perform AROM digit composite flex/ext x10 this session. D/c plan remains appropriate. Will continue to follow acutely.   Follow Up Recommendations  DC plan and follow up therapy as arranged by surgeon;Supervision - Intermittent    Equipment Recommendations  None recommended by OT    Recommendations for Other Services      Precautions / Restrictions Precautions Precautions: Fall Precaution Comments: chest tube Required Braces or Orthoses: Other Brace/Splint Other Brace/Splint: L wrist cock up splint Restrictions Weight Bearing Restrictions: Yes LUE Weight Bearing: Non weight bearing       Mobility Bed Mobility Overal bed mobility: Modified Independent             General bed mobility comments: HOB elevated but no assist required. Pt able to maintain LUE NWB throughout  Transfers                      Balance Overall balance assessment: No apparent balance deficits (not formally assessed)                                         ADL either performed or assessed with clinical judgement   ADL Overall ADL's : Needs assistance/impaired                                       General ADL Comments: Educated pt on donning/doffing splint, checking for signs of pressure or redness at least 2x per day--discontinue wear if notice  pressure and contact MD for follow up, changing stockinette, cleaning of splint daily, splint wear schedule, plastic bag and tape for bathing, and edema management strategies.      Vision       Perception     Praxis      Cognition Arousal/Alertness: Awake/alert Behavior During Therapy: WFL for tasks assessed/performed Overall Cognitive Status: Within Functional Limits for tasks assessed                                          Exercises Exercises: Other exercises Other Exercises Other Exercises: AROM L digits-full composite flex/ext x10 Other Exercises: Educated on elevation and ice for edema control   Shoulder Instructions       General Comments      Pertinent Vitals/ Pain       Pain Assessment: Faces Faces Pain Scale: Hurts little more Pain Location: L shoulder Pain Descriptors / Indicators: Aching;Sore Pain Intervention(s): Monitored during session;Limited activity within patient's tolerance  Home Living  Prior Functioning/Environment              Frequency  Min 2X/week        Progress Toward Goals  OT Goals(current goals can now be found in the care plan section)  Progress towards OT goals: Progressing toward goals  Acute Rehab OT Goals Patient Stated Goal: to go home OT Goal Formulation: With patient  Plan Discharge plan remains appropriate    Co-evaluation                 AM-PAC PT "6 Clicks" Daily Activity     Outcome Measure   Help from another person eating meals?: None Help from another person taking care of personal grooming?: A Little Help from another person toileting, which includes using toliet, bedpan, or urinal?: None Help from another person bathing (including washing, rinsing, drying)?: A Little Help from another person to put on and taking off regular upper body clothing?: A Little Help from another person to put on and taking off regular lower  body clothing?: None 6 Click Score: 21    End of Session    OT Visit Diagnosis: Muscle weakness (generalized) (M62.81);Pain Pain - Right/Left: Left Pain - part of body: Shoulder   Activity Tolerance Patient tolerated treatment well   Patient Left in bed;with call bell/phone within reach   Nurse Communication          Time: [id number] OT Time Calculation (min): 14 min  Charges: OT General Charges $OT Visit: 1 Visit OT Treatments $Orthotics/Prosthetics Check: 8-22 mins  Omarii Scalzo A. Brett Albinooffey, M.S., OTR/L Pager: [phone]   Gaye AlkenBailey A Shelbie Franken 07/06/2017, 11:11 AM

## 2017-07-06 NOTE — Progress Notes (Signed)
3 Days Post-Op   Subjective/Chief Complaint: Soreness with deep breathes but actively trying to do IS - up to 800-900; his goal is 1250; no BM since being here.    Objective: Vital signs in last 24 hours: Temp:  [98 F (36.7 C)-99.4 F (37.4 C)] 99.3 F (37.4 C) (12/08 0748) Pulse Rate:  [64] 64 (12/07 1143) Resp:  [22-23] 23 (12/07 1926) BP: (110-121)/(74-77) 110/77 (12/07 1622) SpO2:  [97 %-98 %] 98 % (12/07 1622) Last BM Date: 07/02/17  Intake/Output from previous day: 12/07 0701 - 12/08 0700 In: -  Out: 505 [Urine:500; Chest Tube:5] Intake/Output this shift: No intake/output data recorded.  Alert, nad, nontoxic cta b/l +air leak  Reg Soft, nt, nd No edema, good cap refill; arm in sling  Lab Results:  Recent Labs    07/04/17 0407  WBC 6.6  HGB 13.8  HCT 41.2  PLT 213   BMET Recent Labs    07/04/17 0407 07/05/17 0424  NA 137 137  K 3.3* 4.3  CL 108 103  CO2 23 26  GLUCOSE 117* 104*  BUN 5* 6  CREATININE 0.76 0.93  CALCIUM 7.6* 8.9   PT/INR No results for input(s): LABPROT, INR in the last 72 hours. ABG No results for input(s): PHART, HCO3 in the last 72 hours.  Invalid input(s): PCO2, PO2  Studies/Results: Dg Chest Port 1 View  Result Date: 07/05/2017 CLINICAL DATA:  Pneumothorax with left chest tube.  Follow-up. EXAM: PORTABLE CHEST 1 VIEW COMPARISON:  07/04/2017 FINDINGS: Slightly increased lucency at the left apex without of visible pleural wind. This could be due to a small amount of pleural air or due to better aeration at the left apex. Certainly there is no large pneumothorax. Mild bibasilar atelectasis persists. Multiple left-sided rib fractures as noted previously IMPRESSION: Increased lucency at the left apex without detection of a pleural line. This could be due to en face pleural air or better aeration at the left apex. No definite or large pneumothorax. Electronically Signed   By: Paulina FusiMark  Shogry M.D.   On: 07/05/2017 08:20     Anti-infectives: Anti-infectives (From admission, onward)   Start     Dose/Rate Route Frequency Ordered Stop   07/03/17 1902  vancomycin (VANCOCIN) powder  Status:  Discontinued       As needed 07/03/17 1904 07/03/17 1923   07/03/17 0800  ceFAZolin (ANCEF) IVPB 2g/100 mL premix     2 g 200 mL/hr over 30 Minutes [facility] 07/02/17 2154 07/03/17 1754      Assessment/Plan: s/p Procedure(s): OPEN REDUCTION INTERNAL FIXATION (ORIF) WRIST FRACTURE (Left) Fall from ladder Left sided PTX L sided rib fractures, 1sr rib and 3-7 -left sided chest tube, continous pulse ox - IS, pulm toilet - CXRwithout PTX but has ongoing air leak - cont ct to suction Left radial frx-s/p ORIF 12/5 Dr. Jena GaussHaddix - NWB LUE  QIO:[id number]:regular diet, saline lock IV; add bowel regimen VTE: SCDs,lovenox ID: Ancef periop Follow up: trauma clinic, [facility]  Dispo: Encourage IS. Mobilize. Await resolution of air leak  Mary SellaEric M. Andrey CampanileWilson, MD, FACS General, Bariatric, & Minimally Invasive Surgery [facility] [place]   LOS: 4 days    Gaynelle Aduric Willy Vorce 07/06/2017

## 2017-07-07 ENCOUNTER — Inpatient Hospital Stay (HOSPITAL_COMMUNITY): Payer: Self-pay

## 2017-07-07 LAB — GLUCOSE, CAPILLARY: GLUCOSE-CAPILLARY: 120 mg/dL — AB (ref 65–99)

## 2017-07-07 MED ORDER — SODIUM CHLORIDE 0.9% FLUSH: 3.0000 mL | INTRAVENOUS | Status: DC | PRN

## 2017-07-07 NOTE — Progress Notes (Signed)
Trauma Service Note  Subjective: Patient doing well.    Objective: Vital signs in last 24 hours: Temp:  [98.1 F (36.7 C)-98.5 F (36.9 C)] 98.2 F (36.8 C) (12/09 0400) Pulse Rate:  [92-104] 104 (12/08 1917) Resp:  [14-25] 20 (12/09 0400) BP: (111-126)/(76-88) 126/88 (12/09 0400) SpO2:  [93 %-98 %] 98 % (12/09 0400) Last BM Date: 07/02/17  Intake/Output from previous day: 12/08 0701 - 12/09 0700 In: -  Out: 2056 [Urine:2050; Chest Tube:6] Intake/Output this shift: No intake/output data recorded.  General: No acute distress  Lungs: Clear.  CXR without PTX.  No air leak today.  Abd: Benign  Extremities: No changes  Neuro: Intact  Lab Results: CBC  No results for input(s): WBC, HGB, HCT, PLT in the last 72 hours. BMET Recent Labs    07/05/17 0424  NA 137  K 4.3  CL 103  CO2 26  GLUCOSE 104*  BUN 6  CREATININE 0.93  CALCIUM 8.9   PT/INR No results for input(s): LABPROT, INR in the last 72 hours. ABG No results for input(s): PHART, HCO3 in the last 72 hours.  Invalid input(s): PCO2, PO2  Studies/Results: Dg Chest Port 1 View  Result Date: 07/07/2017 CLINICAL DATA:  Followup left chest tube and pneumothorax. EXAM: PORTABLE CHEST 1 VIEW COMPARISON:  07/06/2017 FINDINGS: Left chest tube remains in at the apex laterally. I do not see a definite pleural line. Soft tissue air is re- demonstrated. Mild basilar atelectasis persists. IMPRESSION: Left chest tube remains in place. No visible pneumothorax. Persistent subcutaneous air and basilar atelectasis. Electronically Signed   By: Paulina FusiMark  Shogry M.D.   On: 07/07/2017 06:52   Dg Chest Port 1 View  Result Date: 07/06/2017 CLINICAL DATA:  Pneumothorax, left EXAM: PORTABLE CHEST - 1 VIEW COMPARISON:  the previous day's study FINDINGS: Left pigtail chest tube directed laterally towards the apex, stable. Trace apical left pneumothorax. Minimal atelectasis at the lung bases left greater than right as before. Heart size and  mediastinal contours are within normal limits. No effusion. Visualized bones unremarkable. IMPRESSION: 1. Stable left chest tube with trace apical pneumothorax. Electronically Signed   By: Corlis Leak  Hassell M.D.   On: 07/06/2017 09:16    Anti-infectives: Anti-infectives (From admission, onward)   Start     Dose/Rate Route Frequency Ordered Stop   07/03/17 1902  vancomycin (VANCOCIN) powder  Status:  Discontinued       As needed 07/03/17 1904 07/03/17 1923   07/03/17 0800  ceFAZolin (ANCEF) IVPB 2g/100 mL premix     2 g 200 mL/hr over 30 Minutes [facility] 07/02/17 2154 07/03/17 1754      Assessment/Plan: s/p Procedure(s): OPEN REDUCTION INTERNAL FIXATION (ORIF) WRIST FRACTURE No air leak.  May be able to put chest tube to waterseal later today.  LOS: 5 days   Marta LamasJames O. Gae BonWyatt, III, MD, FACS [phone] Trauma Surgeon 07/07/2017

## 2017-07-08 ENCOUNTER — Inpatient Hospital Stay (HOSPITAL_COMMUNITY): Payer: Self-pay

## 2017-07-08 NOTE — Plan of Care (Signed)
  Progressing Safety: Ability to remain free from injury will improve 07/08/2017 2257 - Progressing by Mel AlmondAguilar, Daril Warga D, RN

## 2017-07-08 NOTE — Progress Notes (Signed)
Patient ID: Jaquelyn BitterJames B Lipsitz, male   DOB: April 04, 1971, 46 y.o.   MRN: [id number] 5 Days Post-Op  Subjective: In chair, feeling better Staff reports no air leak  Objective: Vital signs in last 24 hours: Temp:  [97.8 F (36.6 C)-98.5 F (36.9 C)] 98.5 F (36.9 C) (12/10 0407) Pulse Rate:  [50-87] 87 (12/10 0000) Resp:  [11-20] 16 (12/10 0407) BP: (110-126)/(75-87) 110/75 (12/10 0000) SpO2:  [97 %] 97 % (12/10 0000) Last BM Date: 07/02/17  Intake/Output from previous day: 12/09 0701 - 12/10 0700 In: -  Out: 910 [Urine:900; Chest Tube:10] Intake/Output this shift: No intake/output data recorded.  General appearance: alert and cooperative Resp: clear to auscultation bilaterally Cardio: regular rate and rhythm GI: soft, NT  L CT no air leak  Lab Results: CBC  No results for input(s): WBC, HGB, HCT, PLT in the last 72 hours. BMET No results for input(s): NA, K, CL, CO2, GLUCOSE, BUN, CREATININE, CALCIUM in the last 72 hours. PT/INR No results for input(s): LABPROT, INR in the last 72 hours. ABG No results for input(s): PHART, HCO3 in the last 72 hours.  Invalid input(s): PCO2, PO2  Studies/Results: Dg Chest Port 1 View  Result Date: 07/08/2017 CLINICAL DATA:  46 year old male status post fall from roof with rib fractures and left pneumothorax. EXAM: PORTABLE CHEST 1 VIEW COMPARISON:  07/07/2017 and earlier. FINDINGS: Portable AP semi upright view at 0526 hours. Pigtail type left chest tube is stable. Small left apical pleural effusion suspected but no pneumothorax identified. Stable left chest wall subcutaneous gas. Stable to mildly regressed left supraclavicular gas. Stable lung volumes. Mediastinal contours remain normal. Probable small bilateral pleural effusions are stable. No other confluent pulmonary opacity. Negative visible bowel gas pattern. IMPRESSION: 1. Stable left chest tube with no pneumothorax. Probable small bilateral pleural effusions, including in the left apex.  2. Stable to mildly regressed left chest wall and supraclavicular subcutaneous gas. 3. No new cardiopulmonary abnormality. Electronically Signed   By: Odessa FlemingH  Hall M.D.   On: 07/08/2017 07:28   Dg Chest Port 1 View  Result Date: 07/07/2017 CLINICAL DATA:  Followup left chest tube and pneumothorax. EXAM: PORTABLE CHEST 1 VIEW COMPARISON:  07/06/2017 FINDINGS: Left chest tube remains in at the apex laterally. I do not see a definite pleural line. Soft tissue air is re- demonstrated. Mild basilar atelectasis persists. IMPRESSION: Left chest tube remains in place. No visible pneumothorax. Persistent subcutaneous air and basilar atelectasis. Electronically Signed   By: Paulina FusiMark  Shogry M.D.   On: 07/07/2017 06:52    Anti-infectives: Anti-infectives (From admission, onward)   Start     Dose/Rate Route Frequency Ordered Stop   07/03/17 1902  vancomycin (VANCOCIN) powder  Status:  Discontinued       As needed 07/03/17 1904 07/03/17 1923   07/03/17 0800  ceFAZolin (ANCEF) IVPB 2g/100 mL premix     2 g 200 mL/hr over 30 Minutes [facility] 07/02/17 2154 07/03/17 1754      Assessment/Plan: Fall from ladder Left sided PTX L sided rib fractures, 1sr rib and 3-7 -left sided chest tube - H2O seal - CXR in AM Left radial frx-s/p ORIF 12/5 Dr. Jena GaussHaddix - NWB LUE WUJ:WJXBJYNFEN:regular diet, saline lock IV; add bowel regimen VTE: SCDs,lovenox Dispo - chest tube Follow up: trauma clinic, [facility]  LOS: 6 days    Violeta GelinasBurke Annick Dimaio, MD, MPH, FACS Trauma: [phone] General Surgery: [phone]  07/08/2017

## 2017-07-09 ENCOUNTER — Inpatient Hospital Stay (HOSPITAL_COMMUNITY): Payer: Self-pay

## 2017-07-09 LAB — CREATININE, SERUM
Creatinine, Ser: 0.85 mg/dL (ref 0.61–1.24)
GFR calc non Af Amer: >60 mL/min (ref >60)

## 2017-07-09 MED ORDER — METHOCARBAMOL 500 MG PO TABS: 500.0000 mg | ORAL_TABLET | Freq: Three times a day (TID) | ORAL | 0 refills | Status: AC | PRN

## 2017-07-09 MED ORDER — OXYCODONE HCL 5 MG PO TABS: 5.0000 mg | ORAL_TABLET | Freq: Four times a day (QID) | ORAL | 0 refills | Status: AC | PRN

## 2017-07-09 NOTE — Care Management Note (Signed)
Case Management Note  Patient Details  Name: Billy Simmons MRN: [id number] Date of Birth: 13-Apr-1971  Subjective/Objective: Pt admitted on 07/02/17 s/p fall from ladder with fx LT wrist s/p ORIF, rib fractures, and PTX requiring LT chest tube.  PTA, pt independent, lives alone.                     Action/Plan: PT/OT recommending no OP follow up or DME.  Pt plans to dc home with girlfriend at discharge.  May need medication assistance at discharge, as he is uninsured.  Will follow.    Expected Discharge Date:  07/09/17               Expected Discharge Plan:  Home/Self Care  In-House Referral:  Clinical Social Work  Discharge planning Services  CM Consult, Medication Assistance, MATCH Program  Post Acute Care Choice:    Choice offered to:     DME Arranged:    DME Agency:     HH Arranged:    HH Agency:     Status of Service:  Completed, signed off  If discussed at [employer] Meetings, dates discussed:    Additional Comments:  07/09/17 J. Denishia Citro, RN, BSN Pt medically stable for discharge home today.  Pt has questions regarding paying hospital bill, as he is uninsured.  Assured pt that he would likely qualify for discounted bill, and encouraged him to call financial counselor phone # on bill when he receives it to set up payment arrangements.  He states he will do this.    Pt is uninsured, but is eligible for medication assistance [facility]. [facility] given with explanation of program benefits.  Pt appreciative of help given.    Quintella BatonJulie W. Clifford Coudriet, RN, BSN  Trauma/Neuro ICU Case Manager [phone]

## 2017-07-09 NOTE — Progress Notes (Signed)
Patient IV removed, d/c instructions given, questions answered, prescriptions given.  Patient belongings returned to patient from safe.  Pt taken to car via wheel chair.

## 2017-07-09 NOTE — Discharge Instructions (Signed)
**Leave bandage from chest tube removal in place for 72 hours. Ok to shower but do not soak area in tub for at least 1 week. After you remove this dressing can apply bandaid as needed. Continue using incentive spirometer. Call with any concerns including shortness of breath, fever, increased pain... Make sure that you have a chest xray done the day prior to your follow up appointment.   Pneumothorax A pneumothorax, commonly called a collapsed lung, is a condition in which air leaks from a lung and builds up in the space between the lung and the chest wall (pleural space). The air in a pneumothorax is trapped outside the lung and takes up space, preventing the lung from fully expanding. This is a condition that usually occurs suddenly. The buildup of air may be small or large. A small pneumothorax may go away on its own. When a pneumothorax is larger, it will often require medical treatment and hospitalization. What are the causes? A pneumothorax can sometimes happen quickly with no apparent cause. People with underlying lung problems, particularly COPD or emphysema, are at higher risk of pneumothorax. However, pneumothorax can happen quickly even in people with no prior known lung problems. Trauma, surgery, medical procedures, or injury to the chest wall can also cause a pneumothorax. What are the signs or symptoms? Sometimes a pneumothorax will have no symptoms. When symptoms are present, they can include:  Chest pain.  Shortness of breath.  Increased rate of breathing.  Bluish color to your lips or skin (cyanosis).  How is this diagnosed? Pneumothorax is usually diagnosed by a chest X-ray or chest CT scan. Your health care provider will also take a medical history and perform a physical exam to determine why you may have a pneumothorax. How is this treated? A small pneumothorax may go away on its own without treatment. Extra oxygen can sometimes help a small pneumothorax go away more quickly.  For a larger pneumothorax or a pneumothorax that is causing symptoms, a procedure is usually needed to drain the air.In some cases, the health care provider may drain the air using a needle. In other cases, a chest tube may be inserted into the pleural space. A chest tube is a small tube placed between the ribs and into the pleural space. This removes the extra air and allows the lung to expand back to its normal size. A large pneumothorax will usually require a hospital stay. If there is ongoing air leakage into the pleural space, then the chest tube may need to remain in place for several days until the air leak has healed. In some cases, surgery may be needed. Follow these instructions at home:  Only take over-the-counter or prescription medicines as directed by your health care provider.  If a cough or pain makes it difficult for you to sleep at night, try sleeping in a semi-upright position in a recliner or by using 2 or 3 pillows.  Rest and limit activity as directed by your health care provider.  If you had a chest tube and it was removed, ask your health care provider when it is okay to remove the dressing. Until your health care provider says you can remove the dressing, do not allow it to get wet.  Do not smoke. Smoking is a risk factor for pneumothorax.  Do not fly in an airplane or scuba dive until your health care provider says it is okay.  Follow up with your health care provider as directed. Get help right away  if:  You have increasing chest pain or shortness of breath.  You have a cough that is not controlled with suppressants.  You begin coughing up blood.  You have pain that is getting worse or is not controlled with medicines.  You cough up thick, discolored mucus (sputum) that is yellow to green in color.  You have redness, increasing pain, or discharge at the site where a chest tube had been in place (if your pneumothorax was treated with a chest tube).  The site  where your chest tube was located opens up.  You feel air coming out of the site where the chest tube was placed.  You have a fever or persistent symptoms for more than 2-3 days.  You have a fever and your symptoms suddenly get worse. This information is not intended to replace advice given to you by your health care provider. Make sure you discuss any questions you have with your health care provider. Document Released: 07/16/2005 Document Revised: 12/22/2015 Document Reviewed: 12/09/2013 Elsevier Interactive Patient Education  2018 Elsevier Inc.     Cast or Splint Care, Adult Casts and splints are supports that are worn to protect broken bones and other injuries. A cast or splint may hold a bone still and in the correct position while it heals. Casts and splints may also help to ease pain, swelling, and muscle spasms. How to care for your cast  Do not stick anything inside the cast to scratch your skin.  Check the skin around the cast every day. Tell your doctor about any concerns.  You may put lotion on dry skin around the edges of the cast. Do not put lotion on the skin under the cast.  Keep the cast clean.  If the cast is not waterproof: ? Do not let it get wet. ? Cover it with a watertight covering when you take a bath or a shower. How to care for your splint  Wear it as told by your doctor. Take it off only as told by your doctor.  Loosen the splint if your fingers or toes tingle, get numb, or turn cold and blue.  Keep the splint clean.  If the splint is not waterproof: ? Do not let it get wet. ? Cover it with a watertight covering when you take a bath or a shower. Follow these instructions at home: Bathing  Do not take baths or swim until your doctor says it is okay. Ask your doctor if you can take showers. You may only be allowed to take sponge baths for bathing.  If your cast or splint is not waterproof, cover it with a watertight covering when you take a bath  or shower. Managing pain, stiffness, and swelling  Move your fingers or toes often to avoid stiffness and to lessen swelling.  Raise (elevate) the injured area above the level of your heart while sitting or lying down. Safety  Do not use the injured limb to support your body weight until your doctor says that it is okay.  Use crutches or other assistive devices as told by your doctor. General instructions  Do not put pressure on any part of the cast or splint until it is fully hardened. This may take many hours.  Return to your normal activities as told by your doctor. Ask your doctor what activities are safe for you.  Keep all follow-up visits as told by your doctor. This is important. Contact a doctor if:  Your cast or splint gets  damaged.  The skin around the cast gets red or raw.  The skin under the cast is very itchy or painful.  Your cast or splint feels very uncomfortable.  Your cast or splint is too tight or too loose.  Your cast becomes wet or it starts to have a soft spot or area.  You get an object stuck under your cast. Get help right away if:  Your pain gets worse.  The injured area tingles, gets numb, or turns blue and cold.  The part of your body above or below the cast is swollen and it turns a different color (is discolored).  You cannot feel or move your fingers or toes.  There is fluid leaking through the cast.  You have very bad pain or pressure under the cast.  You have trouble breathing.  You have shortness of breath.  You have chest pain. This information is not intended to replace advice given to you by your health care provider. Make sure you discuss any questions you have with your health care provider. Document Released: 11/15/2010 Document Revised: 07/06/2016 Document Reviewed: 07/06/2016 Elsevier Interactive Patient Education  2017 [employer]

## 2017-07-09 NOTE — Progress Notes (Signed)
Occupational Therapy Treatment Patient Details Name: Billy Simmons MRN: [id number] DOB: 04/06/1971 Today's Date: 07/09/2017    History of present illness Pt fell from ladder and fractured left wrist  - Left distal radius fx/ulnar styloid fx (ORIF 12/5), left rib fractures with PTX and left chest tube.  +LOC Amnestic to event.   OT comments  Pt seen for splint check. Splint adjusted to improve fit. Reviewed splint care and contacted ortho PA regarding orders for wound care/dressing changes L wrist. PA to contact nurse with any orders. Completed education regarding compensatory techniques for ADL. Pt complaining of L shoulder pain - recommended pt follow up with his MD regarding L shoulder pain and use ice to help with pain control.   Follow Up Recommendations  DC plan and follow up therapy as arranged by surgeon;Supervision - Intermittent    Equipment Recommendations  None recommended by OT    Recommendations for Other Services      Precautions / Restrictions Precautions Precautions: Other (comment)(s/p L wrist ORIF) Other Brace/Splint: L wrist cock up splint Restrictions LUE Weight Bearing: Non weight bearing Other Position/Activity Restrictions: Pt has sling but no orders for sling       Mobility Bed Mobility Overal bed mobility: Independent                Transfers Overall transfer level: Independent                    Balance Overall balance assessment: No apparent balance deficits (not formally assessed)                                         ADL either performed or assessed with clinical judgement   ADL                                       Functional mobility during ADLs: Independent General ADL Comments: Educated on compensatory techniques fo UB dressing     Vision       Perception     Praxis      Cognition Arousal/Alertness: Awake/alert Behavior During Therapy: WFL for tasks  assessed/performed Overall Cognitive Status: Within Functional Limits for tasks assessed                                          Exercises Other Exercises Other Exercises: AROM L digits-full composite flex/ext x10 Other Exercises: Educated on elevation and ice for edema control Other Exercises: elbow/shoulder AROM within pain tolerance   Shoulder Instructions       General Comments splint adjusted to improve fit gien decreased edema. T-bar adjusted, splint at thumb and @ little MP adjusted    Pertinent Vitals/ Pain       Pain Assessment: 0-10 Pain Score: 2  Pain Location: L shoulder Pain Descriptors / Indicators: Aching;Sore Pain Intervention(s): Limited activity within patient's tolerance  Home Living                                          Prior Functioning/Environment  Frequency  Min 2X/week        Progress Toward Goals  OT Goals(current goals can now be found in the care plan section)  Progress towards OT goals: Goals met/education completed, patient discharged from OT  Acute Rehab OT Goals Patient Stated Goal: to go home OT Goal Formulation: With patient Time For Goal Achievement: 07/18/17 Potential to Achieve Goals: Good ADL Goals Pt Will Perform Upper Body Bathing: with modified independence Pt Will Perform Upper Body Dressing: with modified independence Pt/caregiver will Perform Home Exercise Program: Left upper extremity Additional ADL Goal #1: Pt will independently verbalize 3 strategies for edema control LUE Additional ADL Goal #2: Pt will independently demonstrate ability to donn/doff splint and verbalize udnerstanding of splint care  Plan Discharge plan remains appropriate    Co-evaluation                 AM-PAC PT "6 Clicks" Daily Activity     Outcome Measure   Help from another person eating meals?: None Help from another person taking care of personal grooming?: None Help from  another person toileting, which includes using toliet, bedpan, or urinal?: None Help from another person bathing (including washing, rinsing, drying)?: None Help from another person to put on and taking off regular upper body clothing?: None Help from another person to put on and taking off regular lower body clothing?: None 6 Click Score: 24    End of Session    OT Visit Diagnosis: Muscle weakness (generalized) (M62.81);Pain Pain - Right/Left: Left Pain - part of body: Shoulder   Activity Tolerance Patient tolerated treatment well   Patient Left Other (comment)(walking in room)   Nurse Communication          Time: 7939-6886 OT Time Calculation (min): 11 min  Time: 1335 - 1355 OT Time Calculation: 20 min  Charges: OT General Charges $OT Visit: (2 visits) OT Treatments $Therapeutic Activity: 8-22 mins $Orthotics/Prosthetics Check: 8-22 mins  [facility], OT/L  [phone] 07/09/2017   Marliyah Reid,[name] 07/09/2017, 2:25 PM

## 2017-07-09 NOTE — Progress Notes (Signed)
Patient ID: Billy Simmons, male   DOB: 02/20/1971, 46 y.o.   MRN: [id number]  [facility]  6 Days Post-Op  Subjective: CC- sore No new complaints. States that his ribs are still sore, mostly at night. Denies SOB. Chest tube is on water seal; per RN he has had no air leak. Tolerating diet.  Objective: Vital signs in last 24 hours: Temp:  [97.8 F (36.6 C)-99.1 F (37.3 C)] 98.4 F (36.9 C) (12/11 0738) Pulse Rate:  [84-113] 92 (12/11 0738) Resp:  [18] 18 (12/11 0738) BP: (104-120)/(70-93) 118/85 (12/11 0738) SpO2:  [91 %-98 %] 96 % (12/11 0738) Last BM Date: 07/09/17  Intake/Output from previous day: 12/10 0701 - 12/11 0700 In: -  Out: 702 [Urine:700; Chest Tube:2] Intake/Output this shift: No intake/output data recorded.  PE: Gen:  Alert, NAD, pleasant HEENT: EOM's intact, pupils equal and round Card:  RRR, no M/G/R heard Pulm:  CTAB, no W/R/R, effort normal. CT without air leak Abd: Soft, NT/ND, +BS, no HSM, no hernia Ext:  No calf swelling or tenderness Psych: A&Ox3  Skin: no rashes noted, warm and dry  Lab Results:  No results for input(s): WBC, HGB, HCT, PLT in the last 72 hours. BMET Recent Labs    07/09/17 0351  CREATININE 0.85   PT/INR No results for input(s): LABPROT, INR in the last 72 hours. CMP     Component Value Date/Time   NA 137 07/05/2017 0424   K 4.3 07/05/2017 0424   CL 103 07/05/2017 0424   CO2 26 07/05/2017 0424   GLUCOSE 104 (H) 07/05/2017 0424   BUN 6 07/05/2017 0424   CREATININE 0.85 07/09/2017 0351   CALCIUM 8.9 07/05/2017 0424   PROT 5.3 (L) 07/03/2017 0415   ALBUMIN 3.2 (L) 07/03/2017 0415   AST 30 07/03/2017 0415   ALT 10 (L) 07/03/2017 0415   ALKPHOS 40 07/03/2017 0415   BILITOT 0.9 07/03/2017 0415   GFRNONAA >60 07/09/2017 0351   GFRAA >60 07/09/2017 0351   Lipase  No results found for: LIPASE     Studies/Results: Dg Chest Port 1 View  Result Date: 07/09/2017 CLINICAL DATA:   Left-sided pneumothorax EXAM: PORTABLE CHEST 1 VIEW COMPARISON:  July 08, 2017 FINDINGS: Chest tube remains on the left without appreciable pneumothorax. There are small pleural effusions bilaterally. There is no edema or consolidation. The heart size and pulmonary vascularity are normal. No adenopathy. No evident bone lesions. Note that there is soft tissue air on the left, similar to 1 day prior. IMPRESSION: Chest tube on the left. No pneumothorax. There is subcutaneous air on the left, stable. There are small pleural effusions bilaterally. No edema or consolidation. Stable cardiac silhouette. Electronically Signed   By: Bretta BangWilliam  Woodruff III M.D.   On: 07/09/2017 07:07   Dg Chest Port 1 View  Result Date: 07/08/2017 CLINICAL DATA:  46 year old male status post fall from roof with rib fractures and left pneumothorax. EXAM: PORTABLE CHEST 1 VIEW COMPARISON:  07/07/2017 and earlier. FINDINGS: Portable AP semi upright view at 0526 hours. Pigtail type left chest tube is stable. Small left apical pleural effusion suspected but no pneumothorax identified. Stable left chest wall subcutaneous gas. Stable to mildly regressed left supraclavicular gas. Stable lung volumes. Mediastinal contours remain normal. Probable small bilateral pleural effusions are stable. No other confluent pulmonary opacity. Negative visible bowel gas pattern. IMPRESSION: 1. Stable left chest tube with no pneumothorax. Probable small bilateral pleural effusions, including in the left apex. 2.  Stable to mildly regressed left chest wall and supraclavicular subcutaneous gas. 3. No new cardiopulmonary abnormality. Electronically Signed   By: Odessa FlemingH  Hall M.D.   On: 07/08/2017 07:28    Anti-infectives: Anti-infectives (From admission, onward)   Start     Dose/Rate Route Frequency Ordered Stop   07/03/17 1902  vancomycin (VANCOCIN) powder  Status:  Discontinued       As needed 07/03/17 1904 07/03/17 1923   07/03/17 0800  ceFAZolin (ANCEF) IVPB  2g/100 mL premix     2 g 200 mL/hr over 30 Minutes [facility] 07/02/17 2154 07/03/17 1754       Assessment/Plan Fall from ladder Left sided PTX L sided rib fractures, 1st rib and 3-7 -left sided chest tube - CT on waterseal, CXR this AM shows no pneumothorax. D/c CT today Left radial frx-s/p ORIF 12/5 Dr. Jena GaussHaddix - NWB LUE WUJ:[id number]:regular diet, saline lock IV VTE: SCDs,lovenox Dispo - Chest tube removed. Repeat CXR later today, if stable will plan to discharge. Rx's on chart. Follow up: trauma clinic with CXR, [facility]ix   LOS: 7 days    Franne FortsBrooke A Famous Eisenhardt , [facility] LPA-C [facility] 07/09/2017, 8:18 AM Pager: [phone] Consults: [phone] Mon-Fri 7:00 am-4:30 pm Sat-Sun 7:00 am-11:30 am

## 2017-07-09 NOTE — Discharge Summary (Signed)
[facility] Discharge Summary   Patient ID: Billy BitterJames B Arrellano MRN: [id number] DOB/AGE: 46-Apr-1972 46 y.o.  Admit date: 07/02/2017 Discharge date: 07/09/2017  Admitting Diagnosis: Fall from ladder  Left pneumothorax Left rib fractures, 1 and 3-7 Left distal radius fracture  Discharge Diagnosis Patient Active Problem List   Diagnosis Date Noted  . Closed die-punch intra-articular fracture of distal radius, left, initial encounter 07/04/2017  . Fall from height of greater than 3 feet 07/04/2017  . Fracture of multiple ribs 07/04/2017  . Pneumothorax 07/02/2017    Consultants Orthopedics  Imaging: Dg Chest Port 1 View  Result Date: 07/09/2017 CLINICAL DATA:  Removal of left-sided chest tube. EXAM: PORTABLE CHEST 1 VIEW COMPARISON:  Earlier same day ; 07/08/2017; 07/07/2017; 07/06/2017 FINDINGS: Interval removal of left-sided chest tube without definitive recurrence of left-sided pneumothorax. Grossly unchanged cardiac silhouette and mediastinal contours. The lungs remain hyperexpanded with flattening of the diaphragms and bibasilar heterogeneous opacities, likely atelectasis. No focal airspace opacities. There is persistent blunting of bilateral costophrenic angles without definite pleural effusion. No evidence of edema. No acute osseus abnormalities. There is a minimal amount of subcutaneous emphysema about the left lateral chest wall, unchanged. IMPRESSION: 1. Interval removal of left-sided chest tube without development of a left-sided pneumothorax. 2. Hyperexpanded lungs and bibasilar atelectasis without superimposed acute cardiopulmonary disease. Electronically Signed   By: Simonne ComeJohn  Watts M.D.   On: 07/09/2017 12:37   Dg Chest Port 1 View  Result Date: 07/09/2017 CLINICAL DATA:  Left-sided pneumothorax EXAM: PORTABLE CHEST 1 VIEW COMPARISON:  July 08, 2017 FINDINGS: Chest tube remains on the left without appreciable pneumothorax. There are small pleural effusions  bilaterally. There is no edema or consolidation. The heart size and pulmonary vascularity are normal. No adenopathy. No evident bone lesions. Note that there is soft tissue air on the left, similar to 1 day prior. IMPRESSION: Chest tube on the left. No pneumothorax. There is subcutaneous air on the left, stable. There are small pleural effusions bilaterally. No edema or consolidation. Stable cardiac silhouette. Electronically Signed   By: Bretta BangWilliam  Woodruff III M.D.   On: 07/09/2017 07:07   Dg Chest Port 1 View  Result Date: 07/08/2017 CLINICAL DATA:  46 year old male status post fall from roof with rib fractures and left pneumothorax. EXAM: PORTABLE CHEST 1 VIEW COMPARISON:  07/07/2017 and earlier. FINDINGS: Portable AP semi upright view at 0526 hours. Pigtail type left chest tube is stable. Small left apical pleural effusion suspected but no pneumothorax identified. Stable left chest wall subcutaneous gas. Stable to mildly regressed left supraclavicular gas. Stable lung volumes. Mediastinal contours remain normal. Probable small bilateral pleural effusions are stable. No other confluent pulmonary opacity. Negative visible bowel gas pattern. IMPRESSION: 1. Stable left chest tube with no pneumothorax. Probable small bilateral pleural effusions, including in the left apex. 2. Stable to mildly regressed left chest wall and supraclavicular subcutaneous gas. 3. No new cardiopulmonary abnormality. Electronically Signed   By: Odessa FlemingH  Hall M.D.   On: 07/08/2017 07:28    Procedures Dr. Janee Mornhompson (07/02/17) - Left chest tube insertion Dr. Jena GaussHaddix (07/03/17) - ORIF of intra-articular distal radius fracture  Hospital Course:  Billy Simmons is a 46yo male who [facility] as a level 2 trauma after falling about 2 stories from a ladder. Possible LOC. On arrival he was complaining of left wrist and lung pain with associated SOB. Head CT negative. Workup showed multiple left rib fractures, left pneumothorax, and  left distal radius fracture. Left sided  chest tube was placed in the ED.  Patient was admitted to trauma.  Orthopedics consulted for wrist fracture and took the patient to the OR for ORIF on 12/5. Tolerated procedure well and was transferred back to the floor.  Pneumothorax resolved but patient had a persistent air leak from the chest tube requiring continuous suction. Air leak improved and chest tube was pulled on 12/11; follow up chest xray showed no pneumothorax. On 12/11, the patient was voiding well, tolerating diet, ambulating well, pain well controlled, vital signs stable and felt stable for discharge home.  Patient will follow up with orthopedics in about 2 weeks, and in our office next week regarding pneumothorax. Patient knows to call with questions or concerns.   I have personally reviewed the patients medication history on the [place] controlled substance database.    Allergies as of 07/09/2017   No Known Allergies     Medication List    TAKE these medications   methocarbamol 500 MG tablet Commonly known as:  ROBAXIN Take 1 tablet (500 mg total) by mouth every 8 (eight) hours as needed for muscle spasms.   oxyCODONE 5 MG immediate release tablet Commonly known as:  Oxy IR/ROXICODONE Take 1 tablet (5 mg total) by mouth every 6 (six) hours as needed for severe pain.        Follow-up Information    Haddix, Gillie MannersKevin P, MD. Schedule an appointment as soon as possible for a visit in 2 week(s).   Specialty:  Orthopedic Surgery Contact information: [address] [place] [address] [phone]        [facility]. Go on 07/16/2017.   Why:  Your appointment is at 10:00 AM. Please arrive 30 min prior to appointment time to check in. Bring photo ID and any insurance information. [facility] to [facility] the day prior to your appointment for chest x-ray.  Contact information: [address] [place] [phone]        [facility] Follow up.   Why:  You may walk-in to either location for chest x-ray. If there is any issue with the order, have them call [facility] office at [phone] Contact information: [address] [place], [place] [address]  [address] [place], [place] [address]          Signed: Franne FortsBrooke A Marixa Mellott, [facility] 07/09/2017, 12:59 PM Pager: [phone] Consults: [phone] Mon-Fri 7:00 am-4:30 pm Sat-Sun 7:00 am-11:30 am

## 2017-07-24 ENCOUNTER — Other Ambulatory Visit: Payer: Self-pay | Admitting: General Surgery

## 2017-07-24 ENCOUNTER — Ambulatory Visit
Admission: RE | Admit: 2017-07-24 | Discharge: 2017-07-24 | Disposition: A | Discharge status: Home / Self Care | Payer: No Typology Code available for payment source | Source: Ambulatory Visit | Attending: General Surgery | Admitting: General Surgery

## 2017-07-24 DIAGNOSIS — S2249XA Multiple fractures of ribs, unspecified side, initial encounter for closed fracture: Secondary | ICD-10-CM

## 2019-01-05 IMAGING — CT CT CERVICAL SPINE W/O CM
5 of 8 series · 13 of 33 positions shown, 14 images · non-contrast
Comparison: None.

CLINICAL DATA: Fall from roof

EXAM:
CT HEAD WITHOUT CONTRAST
CT CERVICAL SPINE WITHOUT CONTRAST
TECHNIQUE: Multidetector CT imaging of the head and cervical spine was
performed following the standard protocol without intravenous
contrast. Multiplanar CT image reconstructions of the cervical spine
were also generated.

[Series 4: head bone · axial · 0.46mm/px · z∈[-204,-146]mm · 2 of 87 slices shown]
[im 29/87  bone]
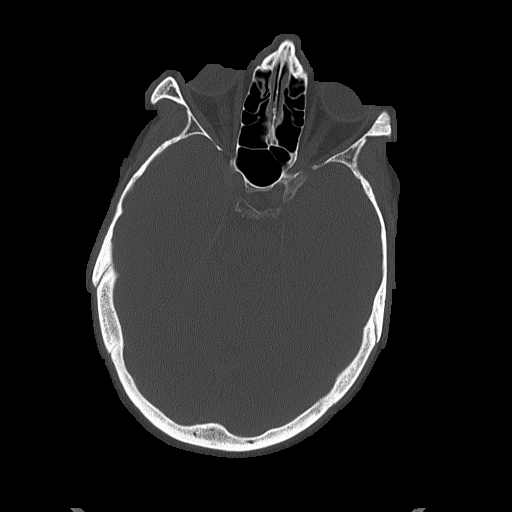
[im 58/87  bone]
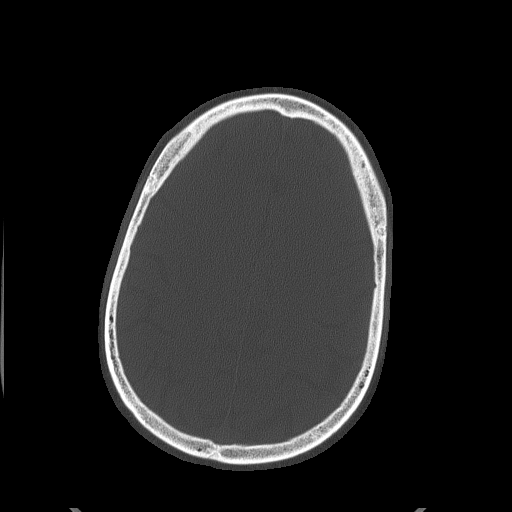

[Series 5: head without cor · coronal · non-contrast · 0.34mm/px · 2 of 67 slices shown]
[im 23/67  bone]
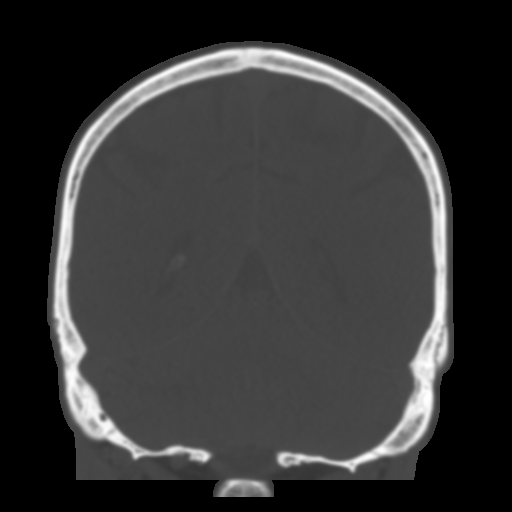
[im 45/67  bone]
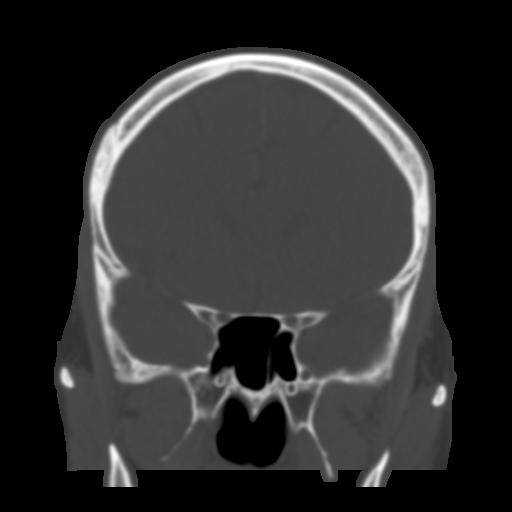

[Series 7: c_spine 2.0 st · axial · 0.29mm/px · z∈[-371,-305]mm · 2 of 100 slices shown, 3 images]
[im 34/100  soft-tissue]
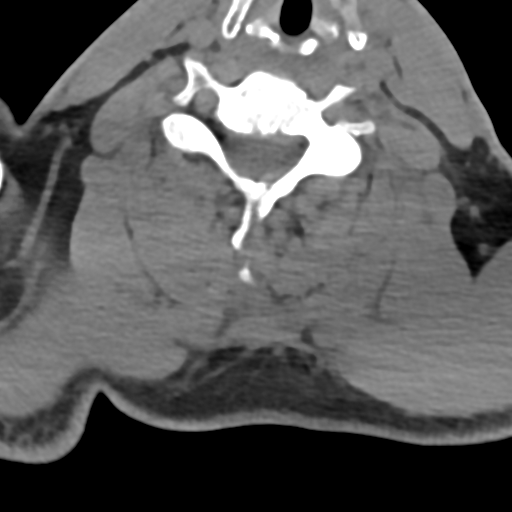
[im 34/100  bone]
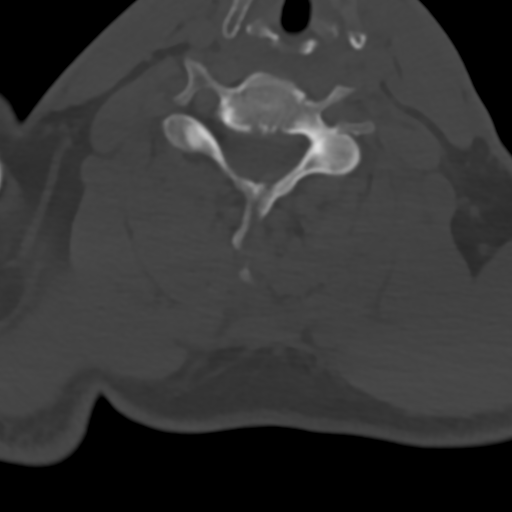
[im 67/100  bone]
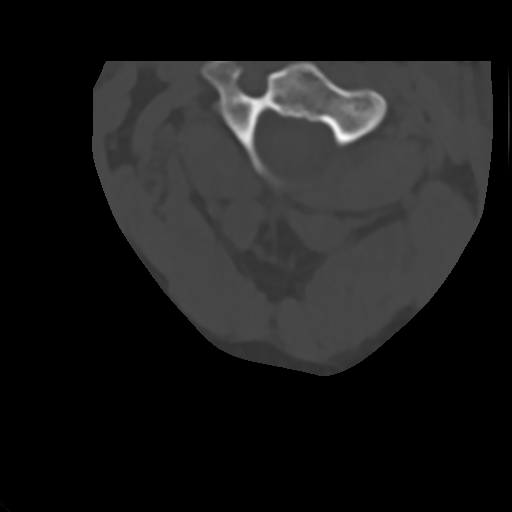

[Series 9: c_spine 2.0 sag bone · sagittal · 0.29mm/px · 5 of 49 slices shown]
[im 9/49  bone]
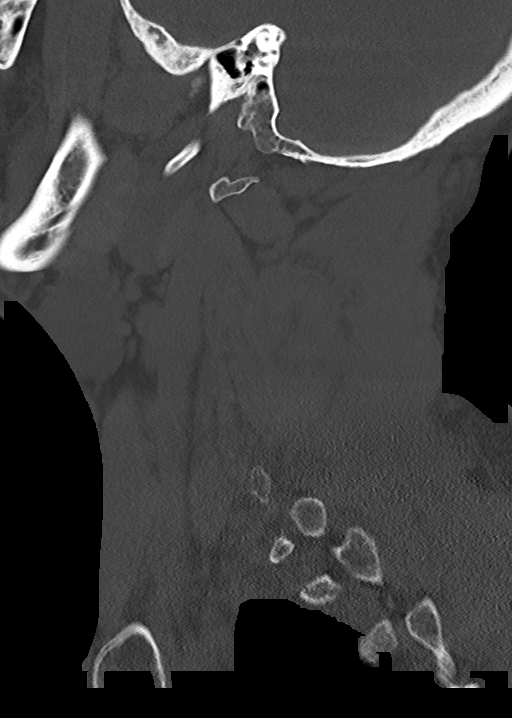
[im 17/49  bone]
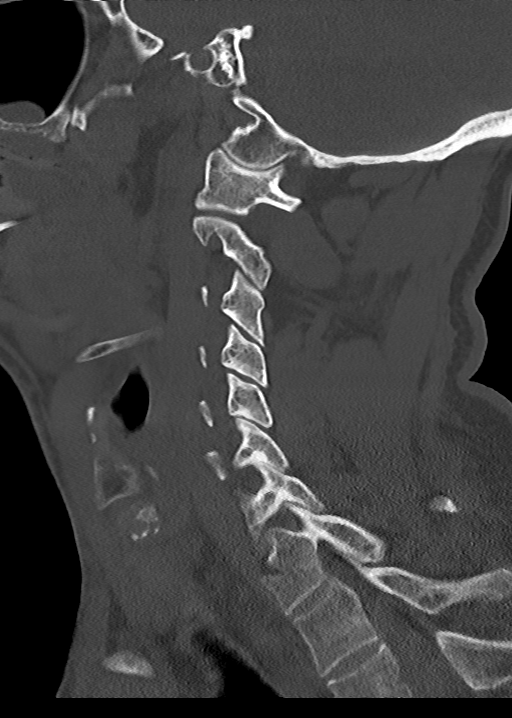
[im 25/49  bone]
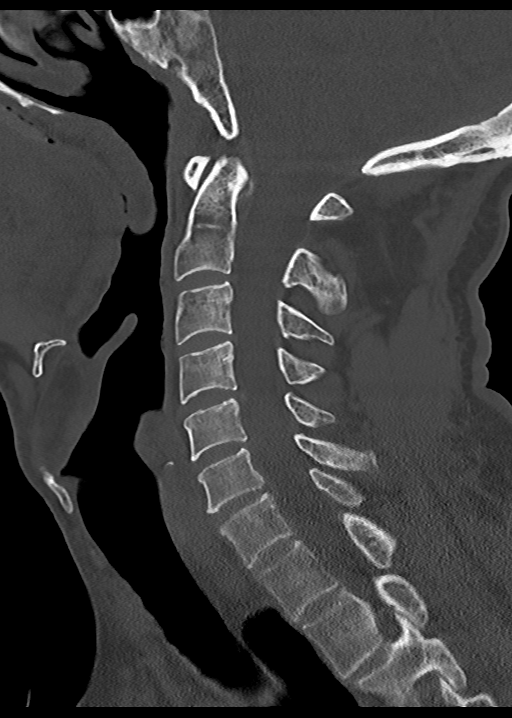
[im 33/49  bone]
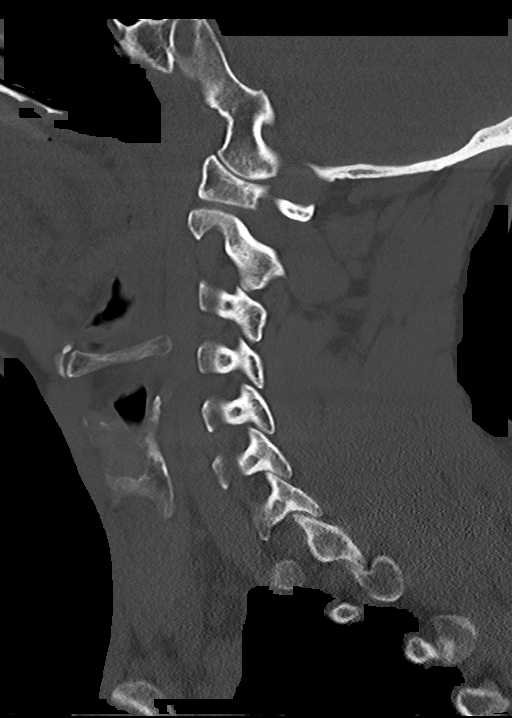
[im 41/49  bone]
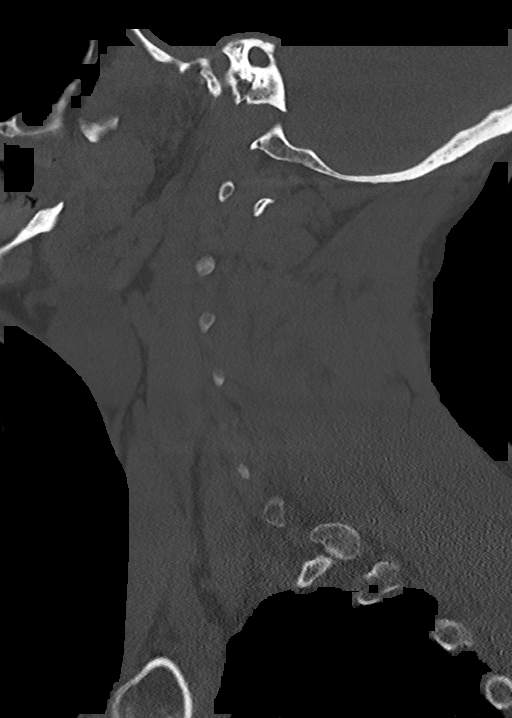

[Series 12: c_spine 2.0 orthogonals · axial · 0.21mm/px · z∈[-393,-322]mm · 2 of 97 slices shown]
[im 33/97  bone]
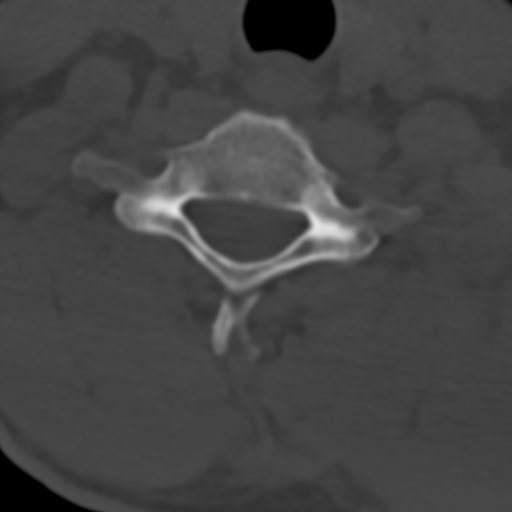
[im 65/97  bone]
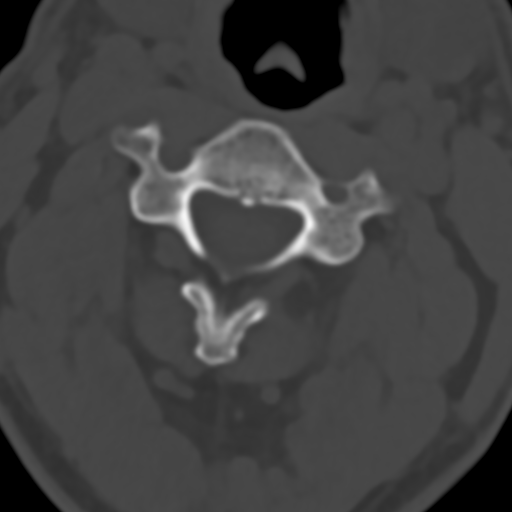

[13 of 33 positions shown; findings below may reference images not displayed]

FINDINGS: CT HEAD FINDINGS

Brain: No evidence of acute infarction, hemorrhage, hydrocephalus,
extra-axial collection or mass lesion/mass effect.

Vascular: Negative

Skull: Negative

Sinuses/Orbits: Mild mucosal edema maxillary sinuses bilaterally.
Normal orbit.

Other: None

CT CERVICAL SPINE FINDINGS

Alignment: Normal

Skull base and vertebrae: Negative for cervical spine fracture

Soft tissues and spinal canal: Negative

Disc levels: Mild disc degeneration. Small central disc protrusion
C5-6 and C6-7

Upper chest: Mildly displaced fracture left first rib. Mild soft
tissue contusion above the left lung apex likely due to mild
bleeding. Left pneumothorax. Apical blebs bilaterally.

Other: None
IMPRESSION: 1. Negative CT head
2. Negative for cervical spine fracture
3. Fracture left first rib with surrounding contusion. Left
pneumothorax.

## 2019-01-06 IMAGING — RF DG C-ARM 61-120 MIN
1 series · 4 of 4 positions shown · non-contrast
Comparison: None.

CLINICAL DATA: ORIF of left wrist

EXAM:
DG C-ARM 61-120 MIN; LEFT WRIST - COMPLETE 3+ VIEW

[Series 1: run · 4 of 4 slices shown]
[im 1/4]
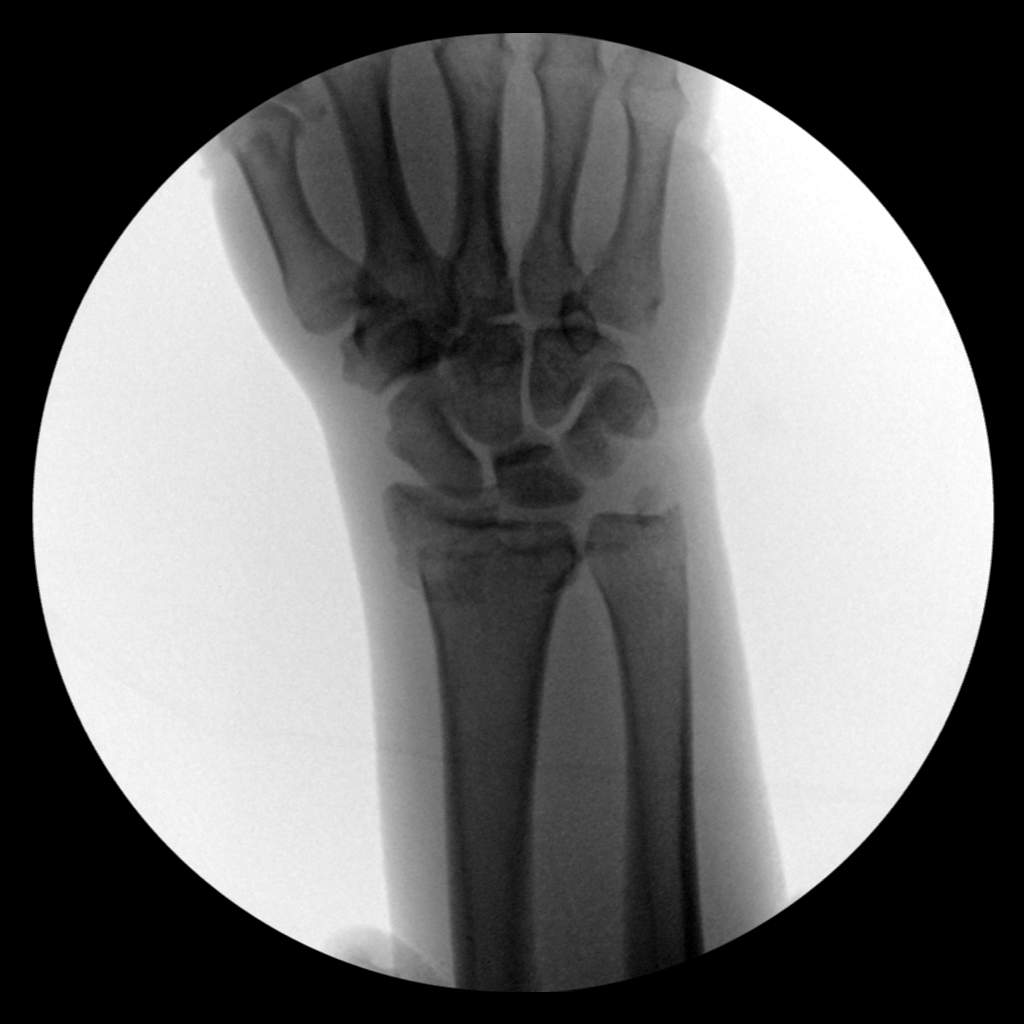
[im 2/4]
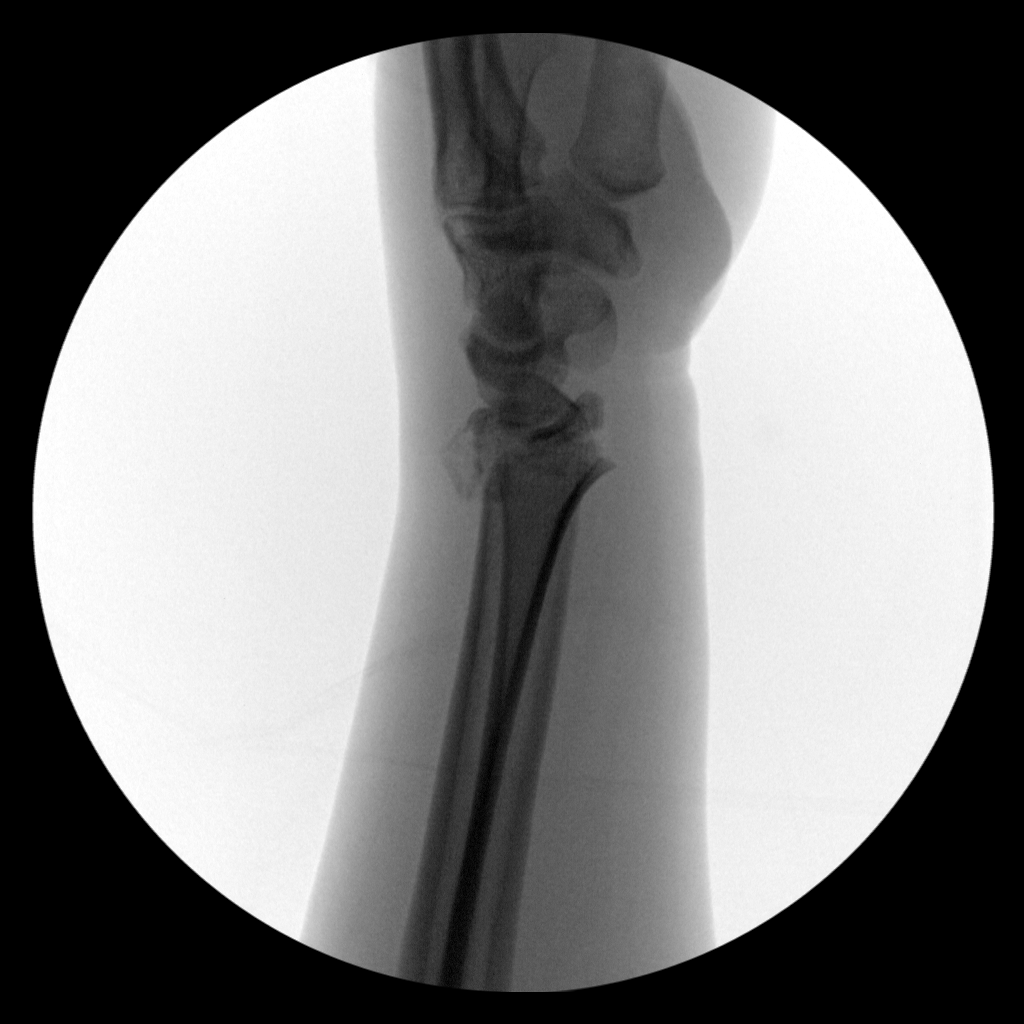
[im 3/4]
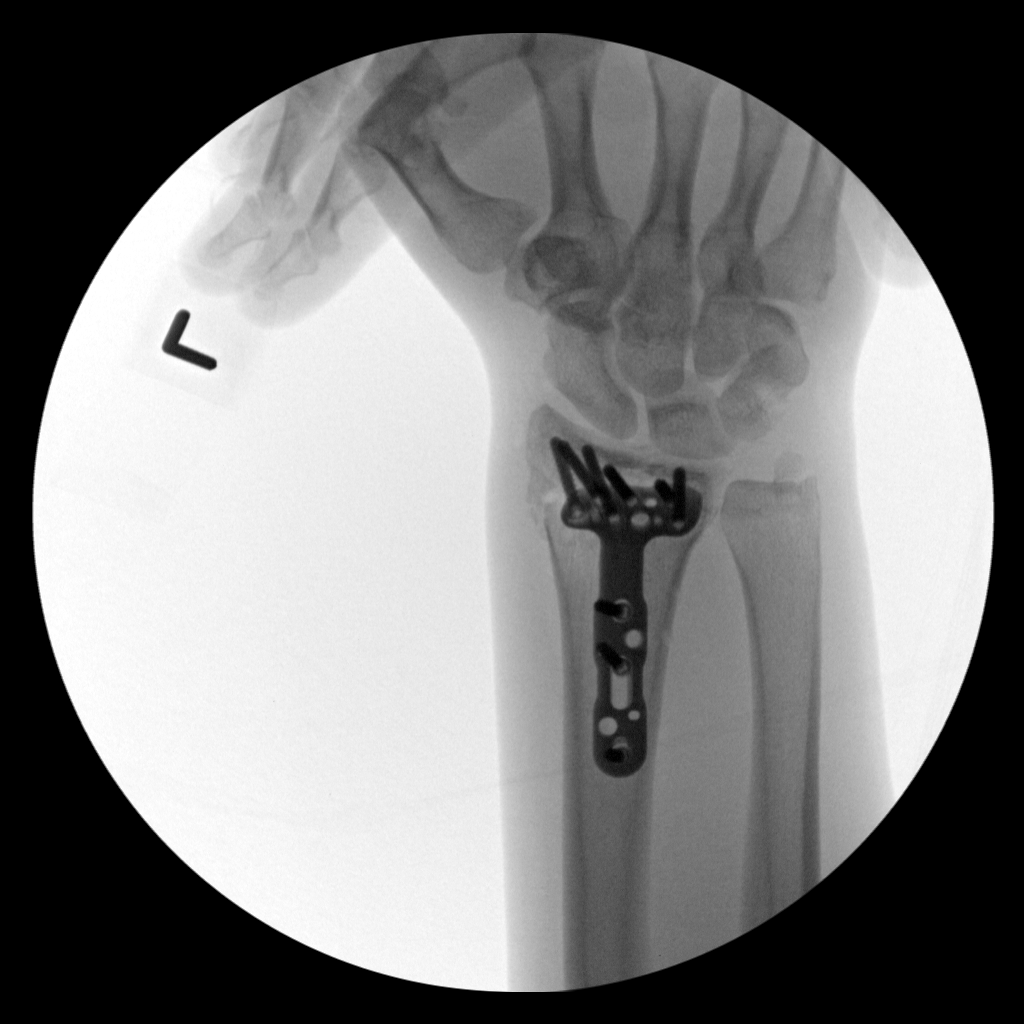
[im 4/4]
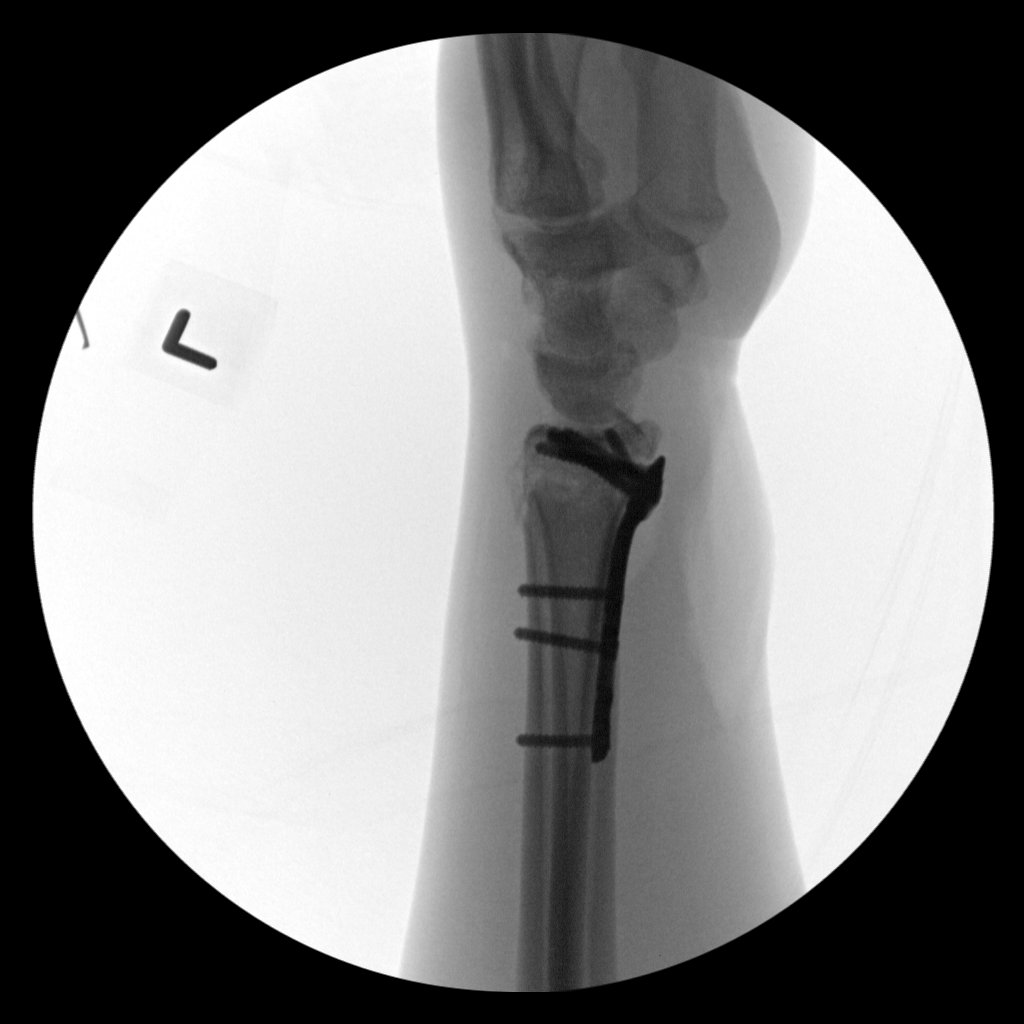

[4 of 4 positions shown; findings below may reference images not displayed]

FINDINGS: 1 minutes 45 seconds of fluoroscopic time for placement of a volar
JHON MARTIN plate fixing a comminuted distal radius fracture is noted.
Nondisplaced ulnar styloid fracture is also seen. Fine bony detail
is limited. Alignment appears near-anatomic post plating.
IMPRESSION: Volar plate and screw fixation of distal comminuted left radius
fracture in near anatomic alignment. Nondisplaced ulnar styloid
fracture.

## 2019-01-06 IMAGING — DX DG CHEST 1V PORT
1 series · 1 of 1 positions shown · non-contrast
Comparison: 07/02/2017

CLINICAL DATA: Follow-up pneumothorax

EXAM:
PORTABLE CHEST 1 VIEW

[chest ap]
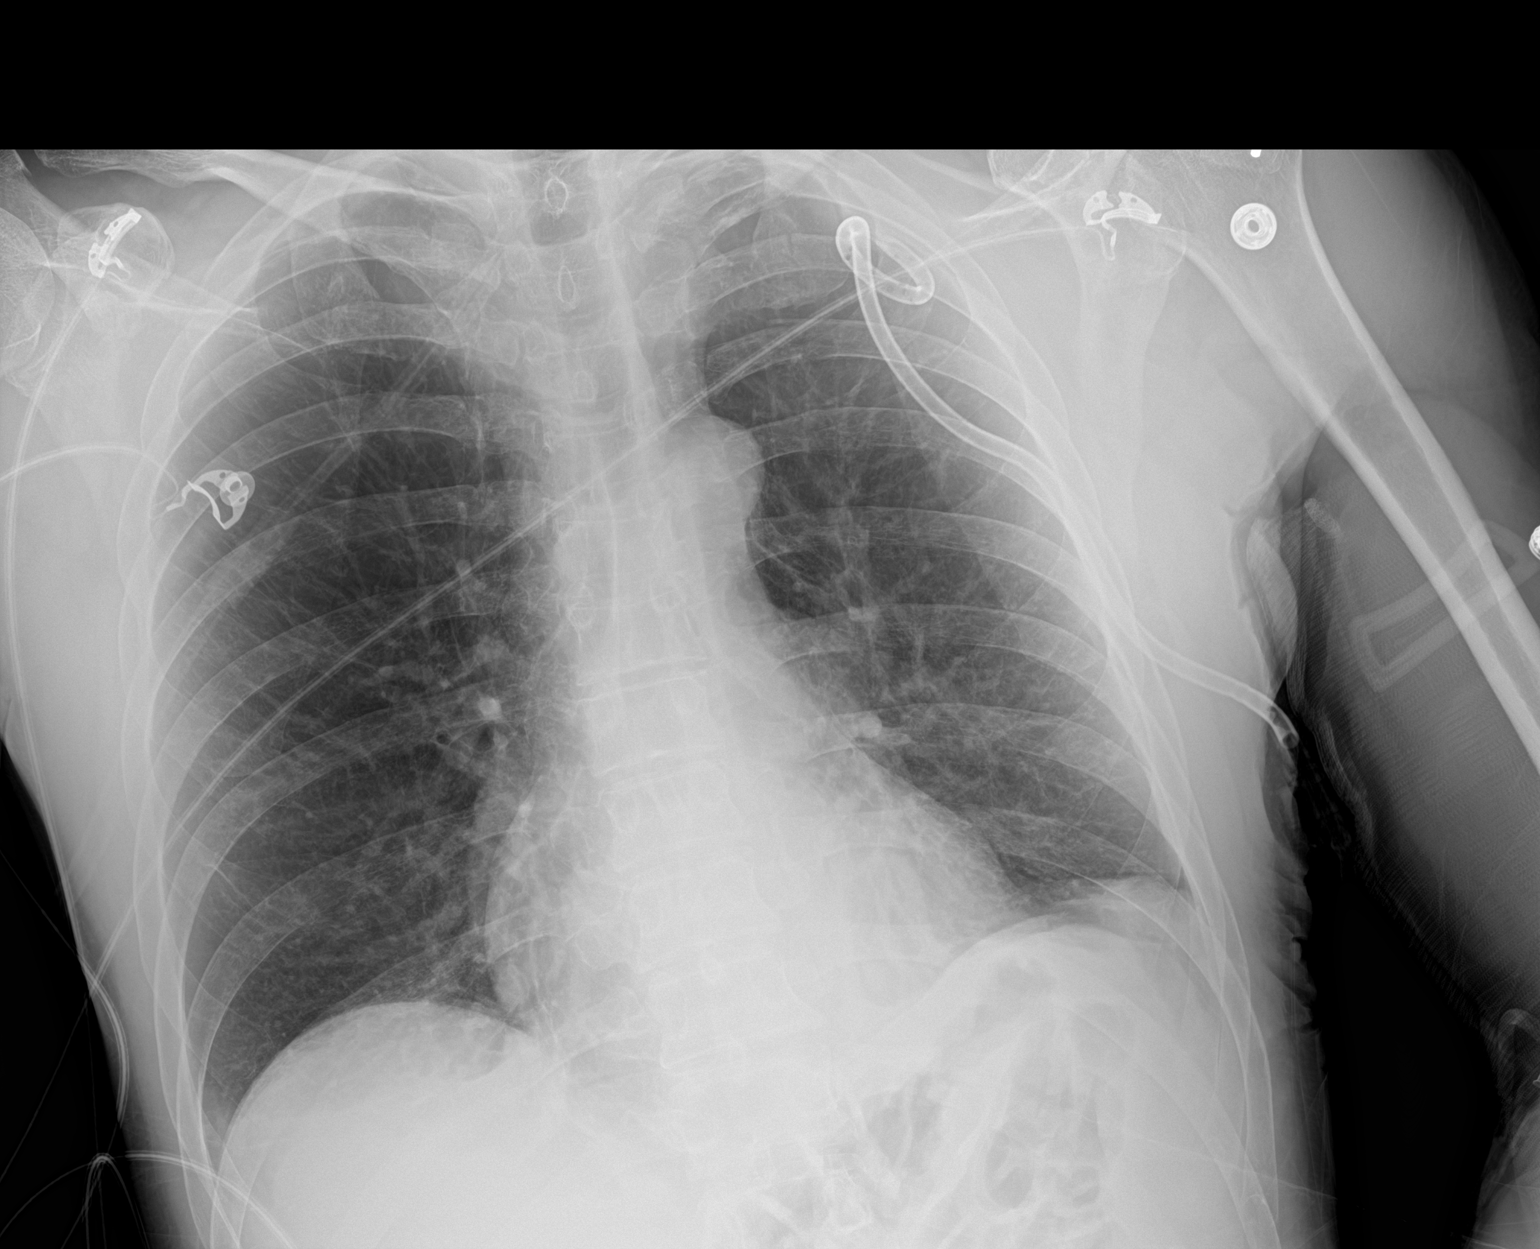

[1 of 1 positions shown; findings below may reference images not displayed]

FINDINGS: Left-sided chest tube is again identified. No sizable pneumothorax
is seen. No focal infiltrate is noted. Mild left basilar atelectasis
is seen. Cardiac shadow is stable. The known rib fractures are less
well appreciated on the current exam. Only the posterior fourth and
fifth rib fractures on the left are noted.
IMPRESSION: No pneumothorax following chest tube placement.

Mild left basilar atelectasis and left rib fractures.

## 2020-06-27 ENCOUNTER — Encounter (HOSPITAL_COMMUNITY): Payer: Self-pay

## 2020-06-27 ENCOUNTER — Ambulatory Visit (HOSPITAL_COMMUNITY)
Admission: EM | Admit: 2020-06-27 | Discharge: 2020-06-27 | Disposition: A | Discharge status: Home / Self Care | Payer: Self-pay | Attending: Family Medicine | Admitting: Family Medicine

## 2020-06-27 ENCOUNTER — Other Ambulatory Visit: Payer: Self-pay

## 2020-06-27 DIAGNOSIS — H579 Unspecified disorder of eye and adnexa: Secondary | ICD-10-CM

## 2020-06-27 DIAGNOSIS — R109 Unspecified abdominal pain: Secondary | ICD-10-CM

## 2020-06-27 DIAGNOSIS — M549 Dorsalgia, unspecified: Secondary | ICD-10-CM

## 2020-06-27 DIAGNOSIS — T1592XA Foreign body on external eye, part unspecified, left eye, initial encounter: Secondary | ICD-10-CM

## 2020-06-27 LAB — POCT URINALYSIS DIPSTICK, ED / UC
Bilirubin Urine: NEGATIVE
Glucose, UA: NEGATIVE mg/dL
Hgb urine dipstick: NEGATIVE
Ketones, ur: NEGATIVE mg/dL
Leukocytes,Ua: NEGATIVE
Nitrite: NEGATIVE
Protein, ur: NEGATIVE mg/dL
Specific Gravity, Urine: 1.02 (ref 1.005–1.030)
Urobilinogen, UA: 0.2 mg/dL (ref 0.0–1.0)
pH: 7.5 (ref 5.0–8.0)

## 2020-06-27 MED ORDER — ERYTHROMYCIN 5 MG/GM OP OINT: TOPICAL_OINTMENT | OPHTHALMIC | 0 refills | Status: AC

## 2020-06-27 NOTE — ED Provider Notes (Signed)
[facility]    CSN: [id number] Arrival date & time: 06/27/20  1055      History   Chief Complaint Chief Complaint  Patient presents with  . Back Pain  . Eye Problem    HPI Billy Simmons is a 49 y.o. male.   Patient presenting today with left eye irritation and foreign body sensation for 2 weeks now after cutting some siding and getting a shard stuck in his eye. Has been doing regular eye flushes without any relief. Eye is light sensitive but vision is intact per patient.   Also having several weeks of left lateral low back pain that feels deeper than the muscular level to patient. Has had this off and on in the past with no identified cause. Denies dysuria, hematuria, injury to back, abdominal pain, constipation, fever, chills, weight loss, CP, SOB. No known hx of kidney stones. Trying OTC anti inflammatories and heat pads with minimal relief.      History reviewed. No pertinent past medical history.  Patient Active Problem List   Diagnosis Date Noted  . Closed die-punch intra-articular fracture of distal radius, left, initial encounter 07/04/2017  . Fall from height of greater than 3 feet 07/04/2017  . Fracture of multiple ribs 07/04/2017  . Pneumothorax 07/02/2017  . Right knee pain 01/14/2015    Past Surgical History:  Procedure Laterality Date  . EYE SURGERY  as a child  . EYE SURGERY    . ORIF WRIST FRACTURE Left 07/03/2017   Procedure: OPEN REDUCTION INTERNAL FIXATION (ORIF) WRIST FRACTURE;  Surgeon: Roby Lofts, MD;  Location: [facility] OR;  Service: Orthopedics;  Laterality: Left;       Home Medications    Prior to Admission medications   Medication Sig Start Date End Date Taking? Authorizing Provider  amoxicillin (AMOXIL) 875 MG tablet Take 1 tablet (875 mg total) by mouth 2 (two) times daily. 12/22/15   Emi Belfast, FNP  erythromycin ophthalmic ointment Place a 1/2 inch ribbon of ointment into the lower eyelid twice daily. 06/27/20    Particia Nearing, PA-C  methocarbamol (ROBAXIN) 500 MG tablet Take 1 tablet (500 mg total) by mouth every 8 (eight) hours as needed for muscle spasms. 07/09/17   Meuth, Brooke A, PA-C  oxyCODONE (OXY IR/ROXICODONE) 5 MG immediate release tablet Take 1 tablet (5 mg total) by mouth every 6 (six) hours as needed for severe pain. 07/09/17   Meuth, Lina Sar, PA-C    Family History Family History  Problem Relation Age of Onset  . Depression Mother   . Mental illness Mother   . Cancer Father     Social History Social History   Tobacco Use  . Smoking status: Former Smoker    Types: Cigarettes    Quit date: 07/31/2011    Years since quitting: 8.9  . Smokeless tobacco: Never Used  Substance Use Topics  . Alcohol use: Yes    Alcohol/week: 12.0 standard drinks    Types: 12 Cans of beer per week  . Drug use: Yes    Types: Marijuana     Allergies   Patient has no known allergies.   Review of Systems Review of Systems PER HPI   Physical Exam Triage Vital Signs ED Triage Vitals  Enc Vitals Group     BP 06/27/20 1150 114/79     Pulse Rate 06/27/20 1150 86     Resp 06/27/20 1150 18     Temp 06/27/20 1150 97.6 F (36.4  C)     Temp Source 06/27/20 1150 Oral     SpO2 06/27/20 1150 98 %     Weight --      Height --      Head Circumference --      Peak Flow --      Pain Score 06/27/20 1149 4     Pain Loc --      Pain Edu? --      Excl. in GC? --    No data found.  Updated Vital Signs BP 114/79 (BP Location: Right Arm)   Pulse 86   Temp 97.6 F (36.4 C) (Oral)   Resp 18   SpO2 98%   Visual Acuity Right Eye Distance:   Left Eye Distance:   Bilateral Distance:    Right Eye Near:   Left Eye Near:    Bilateral Near:     Physical Exam Vitals and nursing note reviewed.  Constitutional:      Appearance: Normal appearance.  HENT:     Head: Atraumatic.     Nose: Nose normal.     Mouth/Throat:     Mouth: Mucous membranes are moist.     Pharynx: Oropharynx is  clear.  Eyes:     Extraocular Movements: Extraocular movements intact.     Conjunctiva/sclera: Conjunctivae normal.     Comments: Pinpoint black colored fleck embedded into conjunctiva over iris medially left eye  Cardiovascular:     Rate and Rhythm: Normal rate and regular rhythm.     Heart sounds: Normal heart sounds.  Pulmonary:     Effort: Pulmonary effort is normal.     Breath sounds: Normal breath sounds.  Abdominal:     General: Bowel sounds are normal. There is no distension.     Palpations: Abdomen is soft.     Tenderness: There is abdominal tenderness (mild ttp left lateral lumbar region with deep palpation). There is no right CVA tenderness, left CVA tenderness or guarding.  Musculoskeletal:        General: Normal range of motion.     Cervical back: Normal range of motion and neck supple.  Skin:    General: Skin is warm and dry.  Neurological:     General: No focal deficit present.     Mental Status: He is oriented to person, place, and time.  Psychiatric:        Mood and Affect: Mood normal.        Thought Content: Thought content normal.        Judgment: Judgment normal.    UC Treatments / Results  Labs (all labs ordered are listed, but only abnormal results are displayed) Labs Reviewed  POCT URINALYSIS DIPSTICK, ED / UC    EKG   Radiology No results found.  Procedures Procedures (including critical care time)  Medications Ordered in UC Medications - No data to display  Initial Impression / Assessment and Plan / UC Course  I have reviewed the triage vital signs and the nursing notes.  Pertinent labs & imaging results that were available during my care of the patient were reviewed by me and considered in my medical decision making (see chart for details).     Eye flush unsuccessful at removing foreign body left eye. Tetracaine drops placed for comfort, erythromycin ointment sent to pharmacy for comfort and protection against bacterial infection.  Discussed close f/u with Eye Specialist to remove foreign body.   U/A benign, exam and vitals unrevealing regarding his left flank  pain. Discussed imaging being an appropriate next step for evaluation given ongoing nature but he does not have PCP or insurance at this time and does not wish to do these. Offered muscle relaxants, he does not feel this would help. Reviewed supportive care and return precautions, f/u with PCP as soon as able.   Final Clinical Impressions(s) / UC Diagnoses   Final diagnoses:  Foreign body, eye, left, initial encounter  Left flank pain   Discharge Instructions   None    ED Prescriptions    Medication Sig Dispense Auth. Provider   erythromycin ophthalmic ointment Place a 1/2 inch ribbon of ointment into the lower eyelid twice daily. 3.5 g Particia Nearing, [place]     PDMP not reviewed this encounter.   Particia Nearing, [place] 06/27/20 1407

## 2020-06-27 NOTE — ED Triage Notes (Signed)
Pt reports having a debris in the left eye x 2-3 weeks. Reports having discomfort, redness. States when he is under the sunlight left eye gets watery.   Pt reports lower back pain x 2 weeks. States is painful when push with the hand "feels like is my kidney". Denies dysuria, fever, chills.
